# Patient Record
Sex: Male | Born: 1940
Health system: Southern US, Community
[De-identification: ages and names within clinical notes are randomized; demographics above are authoritative.]

## PROBLEM LIST (undated history)

## (undated) ENCOUNTER — Emergency Department (HOSPITAL_COMMUNITY): Admission: EM | Payer: Medicare Other | Source: Home / Self Care

## (undated) DIAGNOSIS — Z9989 Dependence on other enabling machines and devices: Secondary | ICD-10-CM

## (undated) DIAGNOSIS — I2510 Atherosclerotic heart disease of native coronary artery without angina pectoris: Secondary | ICD-10-CM

## (undated) DIAGNOSIS — Z8673 Personal history of transient ischemic attack (TIA), and cerebral infarction without residual deficits: Secondary | ICD-10-CM

## (undated) DIAGNOSIS — I10 Essential (primary) hypertension: Secondary | ICD-10-CM

## (undated) DIAGNOSIS — E785 Hyperlipidemia, unspecified: Secondary | ICD-10-CM

## (undated) DIAGNOSIS — F0390 Unspecified dementia without behavioral disturbance: Secondary | ICD-10-CM

## (undated) DIAGNOSIS — I252 Old myocardial infarction: Secondary | ICD-10-CM

## (undated) DIAGNOSIS — I509 Heart failure, unspecified: Secondary | ICD-10-CM

## (undated) DIAGNOSIS — Z87891 Personal history of nicotine dependence: Secondary | ICD-10-CM

## (undated) DIAGNOSIS — Z9049 Acquired absence of other specified parts of digestive tract: Secondary | ICD-10-CM

## (undated) DIAGNOSIS — F1011 Alcohol abuse, in remission: Secondary | ICD-10-CM

## (undated) DIAGNOSIS — G4733 Obstructive sleep apnea (adult) (pediatric): Secondary | ICD-10-CM

## (undated) DIAGNOSIS — I499 Cardiac arrhythmia, unspecified: Secondary | ICD-10-CM

## (undated) DIAGNOSIS — Z8546 Personal history of malignant neoplasm of prostate: Secondary | ICD-10-CM

## (undated) HISTORY — DX: Atherosclerotic heart disease of native coronary artery without angina pectoris: I25.10

## (undated) HISTORY — DX: Cardiac arrhythmia, unspecified: I49.9

## (undated) HISTORY — DX: Personal history of nicotine dependence: Z87.891

## (undated) HISTORY — DX: Alcohol abuse, in remission: F10.11

## (undated) HISTORY — DX: Personal history of malignant neoplasm of prostate: Z85.46

## (undated) HISTORY — DX: Heart failure, unspecified: I50.9

## (undated) HISTORY — DX: Obstructive sleep apnea (adult) (pediatric): G47.33

## (undated) HISTORY — DX: Dependence on other enabling machines and devices: Z99.89

## (undated) HISTORY — PX: CORONARY ANGIOPLASTY WITH STENT PLACEMENT: SHX49

## (undated) HISTORY — DX: Essential (primary) hypertension: I10

## (undated) HISTORY — DX: Hyperlipidemia, unspecified: E78.5

## (undated) HISTORY — DX: Unspecified dementia without behavioral disturbance: F03.90

## (undated) HISTORY — DX: Acquired absence of other specified parts of digestive tract: Z90.49

## (undated) HISTORY — DX: Personal history of transient ischemic attack (TIA), and cerebral infarction without residual deficits: Z86.73

## (undated) HISTORY — DX: Old myocardial infarction: I25.2

---

## 2002-04-30 HISTORY — PX: COLON RESECTION: SHX5231

## 2016-11-22 ENCOUNTER — Ambulatory Visit (INDEPENDENT_AMBULATORY_CARE_PROVIDER_SITE_OTHER): Payer: Medicare Other | Admitting: Internal Medicine

## 2016-11-22 ENCOUNTER — Ambulatory Visit (HOSPITAL_COMMUNITY)
Admission: RE | Admit: 2016-11-22 | Discharge: 2016-11-22 | Disposition: A | Discharge status: Home / Self Care | Payer: Medicare Other | Source: Ambulatory Visit | Attending: Family Medicine | Admitting: Family Medicine

## 2016-11-22 ENCOUNTER — Encounter: Payer: Self-pay | Admitting: Internal Medicine

## 2016-11-22 VITALS — BP 118/80 | HR 110 | Temp 98.6°F | Wt 178.0 lb

## 2016-11-22 DIAGNOSIS — E785 Hyperlipidemia, unspecified: Secondary | ICD-10-CM

## 2016-11-22 DIAGNOSIS — Z8546 Personal history of malignant neoplasm of prostate: Secondary | ICD-10-CM

## 2016-11-22 DIAGNOSIS — Z8673 Personal history of transient ischemic attack (TIA), and cerebral infarction without residual deficits: Secondary | ICD-10-CM

## 2016-11-22 DIAGNOSIS — Z87891 Personal history of nicotine dependence: Secondary | ICD-10-CM

## 2016-11-22 DIAGNOSIS — I4892 Unspecified atrial flutter: Secondary | ICD-10-CM | POA: Insufficient documentation

## 2016-11-22 DIAGNOSIS — I509 Heart failure, unspecified: Secondary | ICD-10-CM

## 2016-11-22 DIAGNOSIS — I499 Cardiac arrhythmia, unspecified: Secondary | ICD-10-CM | POA: Diagnosis not present

## 2016-11-22 DIAGNOSIS — I10 Essential (primary) hypertension: Secondary | ICD-10-CM

## 2016-11-22 DIAGNOSIS — Z87898 Personal history of other specified conditions: Secondary | ICD-10-CM

## 2016-11-22 DIAGNOSIS — G4733 Obstructive sleep apnea (adult) (pediatric): Secondary | ICD-10-CM | POA: Diagnosis not present

## 2016-11-22 DIAGNOSIS — I2510 Atherosclerotic heart disease of native coronary artery without angina pectoris: Secondary | ICD-10-CM | POA: Insufficient documentation

## 2016-11-22 DIAGNOSIS — I252 Old myocardial infarction: Secondary | ICD-10-CM

## 2016-11-22 DIAGNOSIS — Z9049 Acquired absence of other specified parts of digestive tract: Secondary | ICD-10-CM

## 2016-11-22 DIAGNOSIS — Z9989 Dependence on other enabling machines and devices: Secondary | ICD-10-CM

## 2016-11-22 DIAGNOSIS — B351 Tinea unguium: Secondary | ICD-10-CM | POA: Diagnosis not present

## 2016-11-22 DIAGNOSIS — R9431 Abnormal electrocardiogram [ECG] [EKG]: Secondary | ICD-10-CM | POA: Insufficient documentation

## 2016-11-22 DIAGNOSIS — R262 Difficulty in walking, not elsewhere classified: Secondary | ICD-10-CM

## 2016-11-22 DIAGNOSIS — I4430 Unspecified atrioventricular block: Secondary | ICD-10-CM | POA: Insufficient documentation

## 2016-11-22 DIAGNOSIS — R0989 Other specified symptoms and signs involving the circulatory and respiratory systems: Secondary | ICD-10-CM | POA: Insufficient documentation

## 2016-11-22 DIAGNOSIS — F1011 Alcohol abuse, in remission: Secondary | ICD-10-CM

## 2016-11-22 HISTORY — DX: Old myocardial infarction: I25.2

## 2016-11-22 HISTORY — DX: Personal history of malignant neoplasm of prostate: Z85.46

## 2016-11-22 HISTORY — DX: Obstructive sleep apnea (adult) (pediatric): G47.33

## 2016-11-22 HISTORY — DX: Acquired absence of other specified parts of digestive tract: Z90.49

## 2016-11-22 HISTORY — DX: Essential (primary) hypertension: I10

## 2016-11-22 HISTORY — DX: Alcohol abuse, in remission: F10.11

## 2016-11-22 HISTORY — DX: Hyperlipidemia, unspecified: E78.5

## 2016-11-22 HISTORY — DX: Atherosclerotic heart disease of native coronary artery without angina pectoris: I25.10

## 2016-11-22 HISTORY — DX: Personal history of nicotine dependence: Z87.891

## 2016-11-22 HISTORY — DX: Personal history of transient ischemic attack (TIA), and cerebral infarction without residual deficits: Z86.73

## 2016-11-22 HISTORY — DX: Dependence on other enabling machines and devices: Z99.89

## 2016-11-22 HISTORY — DX: Heart failure, unspecified: I50.9

## 2016-11-22 HISTORY — DX: Cardiac arrhythmia, unspecified: I49.9

## 2016-11-22 NOTE — Patient Instructions (Addendum)
Thank you for coming. We will get labs today. We made an urgent referral to the heart doctor because of the irregular heart rhythm.  I will call you when I get the records I made a referral to the heart doctor and podiatry.  Let's wait for the oncology referral until I get your past records as it is possible that I can manage this.

## 2016-11-22 NOTE — Assessment & Plan Note (Addendum)
New finding. Patient is asymptomatic . EKG with atrial flutter that is rate controlled. Urgent referral to cardiology. Already on Metoprolol. ED precautions discussed until seen by cardiology. CBC and BMP obtain for further evaluation. CHADSVASC score of 7. Will wait until we obtain result of kidney function and cardiology input to start anticoagulation. Discussed with preceptor.

## 2016-11-22 NOTE — Progress Notes (Signed)
   [facility] Phone: [phone]   Date of Visit: 11/22/2016   HPI:  Patient is here to establish care. He is here with his wife. He and his wife recently moved to [place]. He requests a referral to podiatry to get his nails trimmed.   PMH:  CAD with stent placed in 1998 History of alcohol abuse (1970s) Hx of Prostate Cancer s/p radiation in 2015 CHF 1998 History of MI HLD  HTN History of OSA (off CPAP since May 2018) History of Stroke   Surgery:  Partial colon resection due to polyp 2004 (benign)  Vaccines:  Reports uptodate on: - tetanus 2015 - PNA 2015 - shingles 2017   Family History:  Mother: HTN, HLD Father: alcohol/drug abuse, heart disease Sister: asthma Brother: healthy  Social History:  - lives with wife - former smoker (quit 1980s) - no alcohol use (history of alcohol use)  - walks daily   ROS:  Review of Systems  Constitutional: Negative for chills, fever and weight loss.  HENT: Negative for congestion and sore throat.   Eyes: Negative for blurred vision.  Respiratory: Negative for cough and shortness of breath.   Cardiovascular: Negative for chest pain, palpitations, orthopnea and leg swelling.  Gastrointestinal: Negative for abdominal pain, constipation, diarrhea, nausea and vomiting.  Genitourinary: Negative for frequency and hematuria.  Musculoskeletal: Negative for joint pain.  Skin: Negative for rash.  Neurological: Negative for dizziness and headaches.  Endo/Heme/Allergies: Negative for polydipsia.  Psychiatric/Behavioral: Positive for memory loss. Negative for depression. The patient is not nervous/anxious.     PHYSICAL EXAM: BP 118/80   Pulse (!) 110   Temp 98.6 F (37 C) (Oral)   Wt 178 lb (80.7 kg)   SpO2 93%  GEN: NAD, sitting on a wheel chair HEENT: Atraumatic, normocephalic, neck supple, EOMI, sclera clear  CV: rate 92 on manual count, irregular rhythm, no murmurs, rubs, or gallops PULM: CTAB,  normal effort ABD: Soft, nontender, nondistended, NABS, no organomegaly SKIN: No rash or cyanosis; warm and well-perfused EXTR: No lower extremity edema or calf tenderness, thick long toe nails bilaterally with onychomycosis of left great toe nail PSYCH: Mood and affect euthymic, normal rate and volume of speech NEURO: Awake, alert, no focal deficits grossly, normal speech   ASSESSMENT/PLAN:  Health maintenance:  - will obtain records from: - Dr. Nona Dell in [place] (oncologist) - Dr. Jimmie Molly PCP ([facility])  - Dr. Luanna Cole Cardiologist at [facility]   Irregular cardiac rhythm New finding. Patient is asymptomatic . EKG with atrial flutter that is rate controlled. Urgent referral to cardiology. Already on Metoprolol. ED precautions discussed until seen by cardiology. CBC and BMP obtain for further evaluation. CHADSVASC score of 7. Will wait until we obtain result of kidney function and cardiology input to start anticoagulation. Discussed with preceptor.   Onychomycosis:  - referral to podiatry   Follow up pending retrieval of records   Smiley Houseman, MD PGY [place]

## 2016-11-23 ENCOUNTER — Encounter: Payer: Self-pay | Admitting: Internal Medicine

## 2016-11-23 DIAGNOSIS — F0390 Unspecified dementia without behavioral disturbance: Secondary | ICD-10-CM

## 2016-11-23 HISTORY — DX: Unspecified dementia, unspecified severity, without behavioral disturbance, psychotic disturbance, mood disturbance, and anxiety: F03.90

## 2016-11-23 LAB — CBC
Hematocrit: 48 % (ref 37.5–51.0)
Hemoglobin: 15.8 g/dL (ref 13.0–17.7)
MCH: 31.9 pg (ref 26.6–33.0)
MCHC: 32.9 g/dL (ref 31.5–35.7)
MCV: 97 fL (ref 79–97)
PLATELETS: 214 10*3/uL (ref 150–379)
RBC: 4.95 x10E6/uL (ref 4.14–5.80)
RDW: 13.5 % (ref 12.3–15.4)
WBC: 7.1 10*3/uL (ref 3.4–10.8)

## 2016-11-23 LAB — BASIC METABOLIC PANEL
BUN / CREAT RATIO: 9 — AB (ref 10–24)
BUN: 11 mg/dL (ref 8–27)
CHLORIDE: 106 mmol/L (ref 96–106)
CO2: 23 mmol/L (ref 20–29)
Calcium: 9.6 mg/dL (ref 8.6–10.2)
Creatinine, Ser: 1.26 mg/dL (ref 0.76–1.27)
GFR calc non Af Amer: 56 mL/min/{1.73_m2} — ABNORMAL LOW (ref >59)
GFR, EST AFRICAN AMERICAN: 65 mL/min/{1.73_m2} (ref >59)
GLUCOSE: 70 mg/dL (ref 65–99)
POTASSIUM: 4.7 mmol/L (ref 3.5–5.2)
Sodium: 143 mmol/L (ref 134–144)

## 2016-11-26 ENCOUNTER — Ambulatory Visit (INDEPENDENT_AMBULATORY_CARE_PROVIDER_SITE_OTHER): Payer: Medicare Other | Admitting: Cardiology

## 2016-11-26 VITALS — BP 150/82 | HR 89 | Ht 71.5 in | Wt 182.0 lb

## 2016-11-26 DIAGNOSIS — I2510 Atherosclerotic heart disease of native coronary artery without angina pectoris: Secondary | ICD-10-CM

## 2016-11-26 DIAGNOSIS — I10 Essential (primary) hypertension: Secondary | ICD-10-CM

## 2016-11-26 DIAGNOSIS — Z87898 Personal history of other specified conditions: Secondary | ICD-10-CM | POA: Diagnosis not present

## 2016-11-26 DIAGNOSIS — Z87891 Personal history of nicotine dependence: Secondary | ICD-10-CM

## 2016-11-26 DIAGNOSIS — I4892 Unspecified atrial flutter: Secondary | ICD-10-CM | POA: Diagnosis not present

## 2016-11-26 DIAGNOSIS — Z8673 Personal history of transient ischemic attack (TIA), and cerebral infarction without residual deficits: Secondary | ICD-10-CM

## 2016-11-26 DIAGNOSIS — E782 Mixed hyperlipidemia: Secondary | ICD-10-CM | POA: Diagnosis not present

## 2016-11-26 DIAGNOSIS — F1011 Alcohol abuse, in remission: Secondary | ICD-10-CM

## 2016-11-26 NOTE — Progress Notes (Deleted)
Cardiology Office Note:    Date:  11/26/2016   ID:  Charles Chan, DOB 04/02/1941, MRN [id number]  PCP:  Smiley Houseman, MD  Cardiologist:  Jenean Lindau, MD   Referring MD: Smiley Houseman, MD    ASSESSMENT:    1. Essential hypertension   2. Coronary artery disease involving native coronary artery of native heart without angina pectoris   3. History of stroke   4. Mixed hyperlipidemia   5. Former smoker   6. History of alcohol abuse   7. Atrial flutter, unspecified type ([place])    PLAN:    In order of problems listed above:  1. ***   Medication Adjustments/Labs and Tests Ordered: Current medicines are reviewed at length with the patient today.  Concerns regarding medicines are outlined above.  No orders of the defined types were placed in this encounter.  No orders of the defined types were placed in this encounter.    History of Present Illness:    Charles Chan is a 76 y.o. male who is being seen today for the evaluation of *** at the request of Dallas Schimke, Almond Lint, MD. ***  Past Medical History:  Diagnosis Date  . Ambulates with cane 11/22/2016   In the 90s, patient injured his knee (ligament tear?) and declined surgery. Since then he has been using a cane to ambulate. Reports he is not in pain.   Marland Kitchen CAD (coronary artery disease) 11/22/2016   With Stent in 1998  . CHF (congestive heart failure) ([place]) 11/22/2016   Diagnosed in 1998  . Dementia 11/23/2016  . Former smoker 11/22/2016   Quit 1980s  . H/O partial resection of colon 11/22/2016   Due to polyp in 2004   . History of alcohol abuse 11/22/2016   1970s  . History of MI (myocardial infarction) 11/22/2016   2001  . History of prostate cancer 11/22/2016   S/p radiation in 2015? Was followed by previous oncologist yearly (Dr. Nona Dell in [place])   . History of stroke 11/22/2016  . HLD (hyperlipidemia) 11/22/2016  . HTN (hypertension) 11/22/2016  . Irregular cardiac rhythm 11/22/2016  .  OSA (obstructive sleep apnea) 11/22/2016   History of OSA. Has not required CPAP since May 2018    Past Surgical History:  Procedure Laterality Date  . COLON RESECTION  2004  . CORONARY ANGIOPLASTY WITH STENT PLACEMENT      Current Medications: Current Meds  Medication Sig  . amLODipine (NORVASC) 10 MG tablet Take 5 mg by mouth daily.  Marland Kitchen aspirin 325 MG EC tablet Take 325 mg by mouth daily.  . cyanocobalamin 1000 MCG tablet Take 1,000 mcg by mouth every 30 (thirty) days.  . metoprolol succinate (TOPROL-XL) 25 MG 24 hr tablet Take 25 mg by mouth daily.  . nitroGLYCERIN (NITROSTAT) 0.4 MG SL tablet Place 0.4 mg under the tongue every 5 (five) minutes as needed for chest pain.  . simvastatin (ZOCOR) 20 MG tablet Take 20 mg by mouth daily at 6 PM.     Allergies:   Shellfish allergy   Social History   Social History  . Marital status: Married    Spouse name: N/A  . Number of children: N/A  . Years of education: N/A   Social History Main Topics  . Smoking status: Former Smoker    Quit date: 1980  . Smokeless tobacco: Never Used  . Alcohol use No  . Drug use: No  . Sexual activity: Not Asked   Other  Topics Concern  . None   Social History Narrative   Ambulates with a cane.         Family History: The patient's family history includes Alcohol abuse in his father; Asthma in his sister; Drug abuse in his father; Healthy in his brother; Heart disease in his father; Hyperlipidemia in his mother; Hypertension in his mother.  ROS:   Please see the history of present illness.    All other systems reviewed and are negative.  EKGs/Labs/Other Studies Reviewed:    The following studies were reviewed today: ***   Recent Labs: 11/22/2016: BUN 11; Creatinine, Ser 1.26; Hemoglobin 15.8; Platelets 214; Potassium 4.7; Sodium 143  Recent Lipid Panel No results found for: CHOL, TRIG, HDL, CHOLHDL, VLDL, LDLCALC, LDLDIRECT  Physical Exam:    VS:  BP (!) 150/78 (BP Location: Left  Arm)   Pulse 89   Ht 5' 11.5" (1.816 m)   Wt 182 lb (82.6 kg)   SpO2 93%   BMI 25.03 kg/m     Wt Readings from Last 3 Encounters:  11/26/16 182 lb (82.6 kg)  11/22/16 178 lb (80.7 kg)     GEN: Patient is in no acute distress HEENT: Normal NECK: No JVD; No carotid bruits LYMPHATICS: No lymphadenopathy CARDIAC: S1 S2 regular, 2/6 systolic murmur at the apex. RESPIRATORY:  Clear to auscultation without rales, wheezing or rhonchi  ABDOMEN: Soft, non-tender, non-distended MUSCULOSKELETAL:  No edema; No deformity  SKIN: Warm and dry NEUROLOGIC:  Alert and oriented x 3 PSYCHIATRIC:  Normal affect    Signed, Jenean Lindau, MD  11/26/2016 1:58 PM    [facility]

## 2016-11-26 NOTE — Progress Notes (Signed)
Cardiology Office Note:    Date:  11/26/2016   ID:  Charles Chan, DOB [birth year], MRN [id number]  PCP:  Smiley Houseman, MD  Cardiologist:  Jenean Lindau, MD   Referring MD: Smiley Houseman, MD    ASSESSMENT:    1. Essential hypertension   2. Coronary artery disease involving native coronary artery of native heart without angina pectoris   3. History of stroke   4. Mixed hyperlipidemia   5. Former smoker   6. History of alcohol abuse   7. Atrial flutter, unspecified type ([place])    PLAN:    In order of problems listed above:  1. I discussed with the patient atrial flutter, disease process. Management and therapy including rate and rhythm control, anticoagulation benefits and potential risks were discussed extensively with the patient. Patient had multiple questions which were answered to patient's satisfaction. I called his primary care physician and had a lengthy discussion about the patient's condition. He rates appear to be optimally controlled but to ensure this he will undergo Holter monitoring. Patient is not a candidate for anticoagulation because of high fall propensity. He also has high risk of stroke however his risks exceed the benefits in a significant way. I discussed this with his primary care provider and his wife and the patient also and he verbalized understanding and will continue full-strength aspirin. Depending on the Holter monitor report I we'll adjust medications for rate control. 2. Coronary artery disease is clinically stable and secondary prevention including lipids are managed by her primary care physician. I discussed this with the patient at length 3. His blood pressure is elevated but his wife mentions to me that his pressure stable at home and she will keep track of it. 4. He will be seen in follow-up appointment in a month or earlier if he has any concerns. He knows to go to the nearest emergency room for any significant concerns. I  advised him never to go back to alcohol or smoking ever in the future and he completely agrees.   Medication Adjustments/Labs and Tests Ordered: Current medicines are reviewed at length with the patient today.  Concerns regarding medicines are outlined above.  Orders Placed This Encounter  Procedures  . Holter monitor - 24 hour  . EKG 12-Lead  . ECHOCARDIOGRAM COMPLETE   No orders of the defined types were placed in this encounter.    History of Present Illness:    Charles Chan is a 76 y.o. male who is being seen today for the evaluation of Newly diagnosed atrial flutter at the request of Smiley Houseman, MD. Patient is a 76 year old male with past medical history of coronary artery disease, essential hypertension, congestive heart failure and dementia. He recently saw his family physician and was found to be in atrial flutter and therefore was referred here. He is a poor historian and history was obtained from his wife who accompanies him. I also called his primary care provider to discuss the details of her evaluation a few days ago. Patient denies any chest pain orthopnea PND or any palpitations. At the time of my evaluation is alert awake oriented and in no distress. Next  Past Medical History:  Diagnosis Date  . Ambulates with cane 11/22/2016   In the 90s, patient injured his knee (ligament tear?) and declined surgery. Since then he has been using a cane to ambulate. Reports he is not in pain.   Marland Kitchen CAD (coronary artery disease) 11/22/2016  With Stent in 1998  . CHF (congestive heart failure) ([place]) 11/22/2016   Diagnosed in 1998  . Dementia 11/23/2016  . Former smoker 11/22/2016   Quit 1980s  . H/O partial resection of colon 11/22/2016   Due to polyp in 2004   . History of alcohol abuse 11/22/2016   1970s  . History of MI (myocardial infarction) 11/22/2016   2001  . History of prostate cancer 11/22/2016   S/p radiation in 2015? Was followed by previous oncologist yearly  (Dr. Nona Dell in [place])   . History of stroke 11/22/2016  . HLD (hyperlipidemia) 11/22/2016  . HTN (hypertension) 11/22/2016  . Irregular cardiac rhythm 11/22/2016  . OSA (obstructive sleep apnea) 11/22/2016   History of OSA. Has not required CPAP since May 2018    Past Surgical History:  Procedure Laterality Date  . COLON RESECTION  2004  . CORONARY ANGIOPLASTY WITH STENT PLACEMENT      Current Medications: Current Meds  Medication Sig  . amLODipine (NORVASC) 10 MG tablet Take 5 mg by mouth daily.  Marland Kitchen aspirin 325 MG EC tablet Take 325 mg by mouth daily.  . cyanocobalamin 1000 MCG tablet Take 1,000 mcg by mouth every 30 (thirty) days.  . metoprolol succinate (TOPROL-XL) 25 MG 24 hr tablet Take 25 mg by mouth daily.  . nitroGLYCERIN (NITROSTAT) 0.4 MG SL tablet Place 0.4 mg under the tongue every 5 (five) minutes as needed for chest pain.  . simvastatin (ZOCOR) 20 MG tablet Take 20 mg by mouth daily at 6 PM.     Allergies:   Shellfish allergy   Social History   Social History  . Marital status: Married    Spouse name: N/A  . Number of children: N/A  . Years of education: N/A   Social History Main Topics  . Smoking status: Former Smoker    Quit date: 1980  . Smokeless tobacco: Never Used  . Alcohol use No  . Drug use: No  . Sexual activity: Not Asked   Other Topics Concern  . None   Social History Narrative   Ambulates with a cane.         Family History: The patient's family history includes Alcohol abuse in his father; Asthma in his sister; Drug abuse in his father; Healthy in his brother; Heart disease in his father; Hyperlipidemia in his mother; Hypertension in his mother.  ROS:   Please see the history of present illness.    All other systems reviewed and are negative.  EKGs/Labs/Other Studies Reviewed:    The following studies were reviewed today: EKG reveals atrial flutter with well-controlled ventricular rate. Records from primary care physician's  office was referred at extensive length.   Recent Labs: 11/22/2016: BUN 11; Creatinine, Ser 1.26; Hemoglobin 15.8; Platelets 214; Potassium 4.7; Sodium 143  Recent Lipid Panel No results found for: CHOL, TRIG, HDL, CHOLHDL, VLDL, LDLCALC, LDLDIRECT  Physical Exam:    VS:  BP (!) 150/78 (BP Location: Left Arm)   Pulse 89   Ht 5' 11.5" (1.816 m)   Wt 182 lb (82.6 kg)   SpO2 93%   BMI 25.03 kg/m     Wt Readings from Last 3 Encounters:  11/26/16 182 lb (82.6 kg)  11/22/16 178 lb (80.7 kg)     GEN: Patient is in no acute distress HEENT: Normal NECK: No JVD; No carotid bruits LYMPHATICS: No lymphadenopathy CARDIAC: S1 S2 regular, 2/6 systolic murmur at the apex. RESPIRATORY:  Clear to auscultation without rales, wheezing  or rhonchi  ABDOMEN: Soft, non-tender, non-distended MUSCULOSKELETAL:  No edema; No deformity  SKIN: Warm and dry NEUROLOGIC:  Alert and oriented x 3 PSYCHIATRIC:  Normal affect    Signed, Jenean Lindau, MD  11/26/2016 2:45 PM    [facility]

## 2016-11-26 NOTE — Patient Instructions (Signed)
Medication Instructions:  Your physician recommends that you continue on your current medications as directed. Please refer to the Current Medication list given to you today.   Labwork: None   Testing/Procedures: Your physician has requested that you have an echocardiogram. Echocardiography is a painless test that uses sound waves to create images of your heart. It provides your doctor with information about the size and shape of your heart and how well your heart's chambers and valves are working. This procedure takes approximately one hour. There are no restrictions for this procedure.  Your physician has recommended that you wear a holter monitor. Holter monitors are medical devices that record the heart's electrical activity. Doctors most often use these monitors to diagnose arrhythmias. Arrhythmias are problems with the speed or rhythm of the heartbeat. The monitor is a small, portable device. You can wear one while you do your normal daily activities. This is usually used to diagnose what is causing palpitations/syncope (passing out).  You had an EKG Today   Follow-Up: Your physician recommends that you schedule a follow-up appointment in: 1 month with Dr Geraldo Pitter    Any Other Special Instructions Will Be Listed Below (If Applicable).     If you need a refill on your cardiac medications before your next appointment, please call your pharmacy.

## 2016-12-06 ENCOUNTER — Ambulatory Visit (INDEPENDENT_AMBULATORY_CARE_PROVIDER_SITE_OTHER): Payer: Medicare Other

## 2016-12-06 ENCOUNTER — Other Ambulatory Visit: Payer: Self-pay

## 2016-12-06 ENCOUNTER — Ambulatory Visit (HOSPITAL_COMMUNITY): Payer: Medicare Other | Attending: Cardiology

## 2016-12-06 DIAGNOSIS — I110 Hypertensive heart disease with heart failure: Secondary | ICD-10-CM | POA: Insufficient documentation

## 2016-12-06 DIAGNOSIS — I083 Combined rheumatic disorders of mitral, aortic and tricuspid valves: Secondary | ICD-10-CM | POA: Diagnosis not present

## 2016-12-06 DIAGNOSIS — I10 Essential (primary) hypertension: Secondary | ICD-10-CM

## 2016-12-06 DIAGNOSIS — I4892 Unspecified atrial flutter: Secondary | ICD-10-CM

## 2016-12-06 DIAGNOSIS — I2510 Atherosclerotic heart disease of native coronary artery without angina pectoris: Secondary | ICD-10-CM

## 2016-12-06 DIAGNOSIS — Z8673 Personal history of transient ischemic attack (TIA), and cerebral infarction without residual deficits: Secondary | ICD-10-CM

## 2016-12-06 DIAGNOSIS — G4733 Obstructive sleep apnea (adult) (pediatric): Secondary | ICD-10-CM | POA: Diagnosis not present

## 2016-12-06 DIAGNOSIS — Z87898 Personal history of other specified conditions: Secondary | ICD-10-CM

## 2016-12-06 DIAGNOSIS — I509 Heart failure, unspecified: Secondary | ICD-10-CM | POA: Diagnosis not present

## 2016-12-06 DIAGNOSIS — I219 Acute myocardial infarction, unspecified: Secondary | ICD-10-CM | POA: Diagnosis not present

## 2016-12-06 DIAGNOSIS — Z87891 Personal history of nicotine dependence: Secondary | ICD-10-CM | POA: Diagnosis not present

## 2016-12-06 DIAGNOSIS — F1011 Alcohol abuse, in remission: Secondary | ICD-10-CM

## 2016-12-06 DIAGNOSIS — E782 Mixed hyperlipidemia: Secondary | ICD-10-CM | POA: Diagnosis not present

## 2016-12-06 DIAGNOSIS — E785 Hyperlipidemia, unspecified: Secondary | ICD-10-CM | POA: Diagnosis not present

## 2016-12-06 DIAGNOSIS — F0390 Unspecified dementia without behavioral disturbance: Secondary | ICD-10-CM | POA: Insufficient documentation

## 2016-12-06 DIAGNOSIS — I371 Nonrheumatic pulmonary valve insufficiency: Secondary | ICD-10-CM | POA: Insufficient documentation

## 2016-12-19 ENCOUNTER — Encounter: Payer: Self-pay | Admitting: Podiatry

## 2016-12-19 ENCOUNTER — Telehealth: Payer: Self-pay

## 2016-12-19 ENCOUNTER — Ambulatory Visit (INDEPENDENT_AMBULATORY_CARE_PROVIDER_SITE_OTHER): Payer: Medicare Other | Admitting: Podiatry

## 2016-12-19 DIAGNOSIS — M79676 Pain in unspecified toe(s): Secondary | ICD-10-CM | POA: Diagnosis not present

## 2016-12-19 DIAGNOSIS — B351 Tinea unguium: Secondary | ICD-10-CM

## 2016-12-19 NOTE — Telephone Encounter (Signed)
-----   Message from Jenean Lindau, MD sent at 12/19/2016  3:07 PM EDT ----- Regarding: FW: Abnormal 48 hour holter monitor results Pl call pt about holter report. Stop long acting metoprolol and start metoprolol tartrate 12.5mg  in morning only ----- Message ----- From: Markus Daft A Sent: 12/11/2016   5:10 PM To: Orland Penman, CMA, Jenean Lindau, MD Subject: Abnormal 48 hour holter monitor results        Abnormal 48 hour holter monitor results available for your review.  Please go to " [employer] Studies,  Assigned to me,  Reading Work List ",  to review patients monitor and sign the study.   Thank you, Darrick Penna

## 2016-12-19 NOTE — Progress Notes (Signed)
Patient ID: Charles Chan, male   DOB: 1940/05/02, 76 y.o.   MRN: [id number]   SUBJECTIVE Patient  presents to office today complaining of elongated, thickened nails. Pain while ambulating in shoes. Patient is unable to trim their own nails.   OBJECTIVE General Patient is awake, alert, and oriented x 3 and in no acute distress. Derm Skin is dry and supple bilateral. Negative open lesions or macerations. Remaining integument unremarkable. Nails are tender, long, thickened and dystrophic with subungual debris, consistent with onychomycosis, 1-5 bilateral. No signs of infection noted. Vasc  DP and PT pedal pulses palpable bilaterally. Temperature gradient within normal limits.  Neuro Epicritic and protective threshold sensation diminished bilaterally.  Musculoskeletal Exam No symptomatic pedal deformities noted bilateral. Muscular strength within normal limits.  ASSESSMENT 1. Onychodystrophic nails 1-5 bilateral with hyperkeratosis of nails.  2. Onychomycosis of nail due to dermatophyte bilateral 3. Pain in foot bilateral  PLAN OF CARE 1. Patient evaluated today.  2. Instructed to maintain good pedal hygiene and foot care.  3. Mechanical debridement of nails 1-5 bilaterally performed using a nail nipper. Filed with dremel without incident.  4. Return to clinic in 3 mos.    Edrick Kins, DPM [facility]  Dr. Edrick Kins, [address]                                        [place], [place] [address]                Office [phone]  Fax [phone]

## 2016-12-19 NOTE — Telephone Encounter (Signed)
Attempted to call pt with abnormal results and new medication regiment. No answer or VM. Will try again later.

## 2016-12-19 NOTE — Progress Notes (Signed)
   Subjective:    Patient ID: Charles Chan, male    DOB: 01-27-1941, 76 y.o.   MRN: [id number]  HPI    Review of Systems  All other systems reviewed and are negative.      Objective:   Physical Exam        Assessment & Plan:

## 2017-02-06 ENCOUNTER — Ambulatory Visit (INDEPENDENT_AMBULATORY_CARE_PROVIDER_SITE_OTHER): Payer: Medicare Other | Admitting: *Deleted

## 2017-02-06 DIAGNOSIS — Z23 Encounter for immunization: Secondary | ICD-10-CM | POA: Diagnosis not present

## 2017-03-24 ENCOUNTER — Emergency Department (HOSPITAL_COMMUNITY): Payer: Medicare Other

## 2017-03-24 ENCOUNTER — Encounter (HOSPITAL_COMMUNITY): Payer: Self-pay | Admitting: Emergency Medicine

## 2017-03-24 ENCOUNTER — Inpatient Hospital Stay (HOSPITAL_COMMUNITY)
Admission: EM | Admit: 2017-03-24 | Discharge: 2017-03-27 | DRG: 392 | Disposition: A | Discharge status: Home / Self Care | Payer: Medicare Other | Attending: Family Medicine | Admitting: Family Medicine

## 2017-03-24 DIAGNOSIS — K529 Noninfective gastroenteritis and colitis, unspecified: Secondary | ICD-10-CM | POA: Diagnosis not present

## 2017-03-24 DIAGNOSIS — R05 Cough: Secondary | ICD-10-CM

## 2017-03-24 DIAGNOSIS — K630 Abscess of intestine: Secondary | ICD-10-CM | POA: Diagnosis present

## 2017-03-24 DIAGNOSIS — N179 Acute kidney failure, unspecified: Secondary | ICD-10-CM | POA: Diagnosis present

## 2017-03-24 DIAGNOSIS — Z7982 Long term (current) use of aspirin: Secondary | ICD-10-CM

## 2017-03-24 DIAGNOSIS — E785 Hyperlipidemia, unspecified: Secondary | ICD-10-CM | POA: Diagnosis present

## 2017-03-24 DIAGNOSIS — I2510 Atherosclerotic heart disease of native coronary artery without angina pectoris: Secondary | ICD-10-CM | POA: Diagnosis present

## 2017-03-24 DIAGNOSIS — I10 Essential (primary) hypertension: Secondary | ICD-10-CM

## 2017-03-24 DIAGNOSIS — R059 Cough, unspecified: Secondary | ICD-10-CM

## 2017-03-24 DIAGNOSIS — Z955 Presence of coronary angioplasty implant and graft: Secondary | ICD-10-CM

## 2017-03-24 DIAGNOSIS — I509 Heart failure, unspecified: Secondary | ICD-10-CM | POA: Diagnosis present

## 2017-03-24 DIAGNOSIS — I252 Old myocardial infarction: Secondary | ICD-10-CM

## 2017-03-24 DIAGNOSIS — F0390 Unspecified dementia without behavioral disturbance: Secondary | ICD-10-CM | POA: Diagnosis present

## 2017-03-24 DIAGNOSIS — I110 Hypertensive heart disease with heart failure: Secondary | ICD-10-CM | POA: Diagnosis present

## 2017-03-24 DIAGNOSIS — Z8546 Personal history of malignant neoplasm of prostate: Secondary | ICD-10-CM

## 2017-03-24 DIAGNOSIS — Z87891 Personal history of nicotine dependence: Secondary | ICD-10-CM

## 2017-03-24 DIAGNOSIS — Z79899 Other long term (current) drug therapy: Secondary | ICD-10-CM

## 2017-03-24 DIAGNOSIS — Z85038 Personal history of other malignant neoplasm of large intestine: Secondary | ICD-10-CM

## 2017-03-24 DIAGNOSIS — Z8673 Personal history of transient ischemic attack (TIA), and cerebral infarction without residual deficits: Secondary | ICD-10-CM

## 2017-03-24 DIAGNOSIS — I4892 Unspecified atrial flutter: Secondary | ICD-10-CM | POA: Diagnosis present

## 2017-03-24 DIAGNOSIS — J439 Emphysema, unspecified: Secondary | ICD-10-CM | POA: Diagnosis present

## 2017-03-24 DIAGNOSIS — G4733 Obstructive sleep apnea (adult) (pediatric): Secondary | ICD-10-CM | POA: Diagnosis present

## 2017-03-24 DIAGNOSIS — R109 Unspecified abdominal pain: Secondary | ICD-10-CM | POA: Diagnosis not present

## 2017-03-24 LAB — URINALYSIS, ROUTINE W REFLEX MICROSCOPIC
Bilirubin Urine: NEGATIVE
Glucose, UA: NEGATIVE mg/dL
HGB URINE DIPSTICK: NEGATIVE
Ketones, ur: NEGATIVE mg/dL
Leukocytes, UA: NEGATIVE
NITRITE: NEGATIVE
PROTEIN: NEGATIVE mg/dL
Specific Gravity, Urine: >1.046 — ABNORMAL HIGH (ref 1.005–1.030)
pH: 6 (ref 5.0–8.0)

## 2017-03-24 LAB — LIPASE, BLOOD: Lipase: 21 U/L (ref 11–51)

## 2017-03-24 LAB — CREATININE, SERUM
Creatinine, Ser: 1.1 mg/dL (ref 0.61–1.24)
GFR calc Af Amer: >60 mL/min (ref >60)
GFR calc non Af Amer: >60 mL/min (ref >60)

## 2017-03-24 LAB — COMPREHENSIVE METABOLIC PANEL
ALK PHOS: 78 U/L (ref 38–126)
ALT: 11 U/L — AB (ref 17–63)
AST: 17 U/L (ref 15–41)
Albumin: 3.5 g/dL (ref 3.5–5.0)
Anion gap: 6 (ref 5–15)
BILIRUBIN TOTAL: 1 mg/dL (ref 0.3–1.2)
BUN: 12 mg/dL (ref 6–20)
CALCIUM: 9 mg/dL (ref 8.9–10.3)
CO2: 22 mmol/L (ref 22–32)
CREATININE: 1.31 mg/dL — AB (ref 0.61–1.24)
Chloride: 110 mmol/L (ref 101–111)
GFR, EST NON AFRICAN AMERICAN: 52 mL/min — AB (ref >60)
Glucose, Bld: 109 mg/dL — ABNORMAL HIGH (ref 65–99)
Potassium: 3.6 mmol/L (ref 3.5–5.1)
Sodium: 138 mmol/L (ref 135–145)
TOTAL PROTEIN: 7.1 g/dL (ref 6.5–8.1)

## 2017-03-24 LAB — CBC
HCT: 43.5 % (ref 39.0–52.0)
HEMATOCRIT: 43.1 % (ref 39.0–52.0)
Hemoglobin: 15 g/dL (ref 13.0–17.0)
Hemoglobin: 14.6 g/dL (ref 13.0–17.0)
MCH: 32.5 pg (ref 26.0–34.0)
MCH: 31.9 pg (ref 26.0–34.0)
MCHC: 34.5 g/dL (ref 30.0–36.0)
MCHC: 33.9 g/dL (ref 30.0–36.0)
MCV: 94.2 fL (ref 78.0–100.0)
MCV: 94.3 fL (ref 78.0–100.0)
PLATELETS: 196 10*3/uL (ref 150–400)
PLATELETS: 188 10*3/uL (ref 150–400)
RBC: 4.62 MIL/uL (ref 4.22–5.81)
RBC: 4.57 MIL/uL (ref 4.22–5.81)
RDW: 13.4 % (ref 11.5–15.5)
RDW: 13.3 % (ref 11.5–15.5)
WBC: 13.4 10*3/uL — AB (ref 4.0–10.5)
WBC: 10.8 10*3/uL — AB (ref 4.0–10.5)

## 2017-03-24 LAB — GLUCOSE, CAPILLARY: Glucose-Capillary: 121 mg/dL — ABNORMAL HIGH (ref 65–99)

## 2017-03-24 MED ORDER — ONDANSETRON HCL 4 MG/2ML IJ SOLN
4.0000 mg | Freq: Once | INTRAMUSCULAR | Status: AC
  Administered 2017-03-24: 4 mg via INTRAVENOUS
  Filled 2017-03-24: qty 2

## 2017-03-24 MED ORDER — ENOXAPARIN SODIUM 40 MG/0.4ML ~~LOC~~ SOLN
40.0000 mg | SUBCUTANEOUS | Status: DC
  Administered 2017-03-24 – 2017-03-26 (×3): 40 mg via SUBCUTANEOUS
  Filled 2017-03-24 (×3): qty 0.4

## 2017-03-24 MED ORDER — BENZONATATE 100 MG PO CAPS
100.0000 mg | ORAL_CAPSULE | Freq: Two times a day (BID) | ORAL | Status: DC | PRN
  Administered 2017-03-25: 100 mg via ORAL
  Filled 2017-03-24: qty 1

## 2017-03-24 MED ORDER — IPRATROPIUM-ALBUTEROL 0.5-2.5 (3) MG/3ML IN SOLN
3.0000 mL | Freq: Once | RESPIRATORY_TRACT | Status: AC
  Administered 2017-03-24: 3 mL via RESPIRATORY_TRACT
  Filled 2017-03-24: qty 3

## 2017-03-24 MED ORDER — PIPERACILLIN-TAZOBACTAM 3.375 G IVPB 30 MIN
3.3750 g | INTRAVENOUS | Status: AC
  Administered 2017-03-24: 3.375 g via INTRAVENOUS
  Filled 2017-03-24: qty 50

## 2017-03-24 MED ORDER — ONDANSETRON 4 MG PO TBDP
4.0000 mg | ORAL_TABLET | Freq: Three times a day (TID) | ORAL | Status: DC | PRN
  Filled 2017-03-24: qty 1

## 2017-03-24 MED ORDER — PIPERACILLIN-TAZOBACTAM 3.375 G IVPB
3.3750 g | Freq: Three times a day (TID) | INTRAVENOUS | Status: DC
  Administered 2017-03-24 – 2017-03-26 (×6): 3.375 g via INTRAVENOUS
  Filled 2017-03-24 (×7): qty 50

## 2017-03-24 MED ORDER — AMLODIPINE BESYLATE 5 MG PO TABS
5.0000 mg | ORAL_TABLET | Freq: Every day | ORAL | Status: DC
  Administered 2017-03-25 – 2017-03-27 (×3): 5 mg via ORAL
  Filled 2017-03-24 (×3): qty 1

## 2017-03-24 MED ORDER — SODIUM CHLORIDE 0.9 % IV BOLUS (SEPSIS)
1000.0000 mL | Freq: Once | INTRAVENOUS | Status: AC
Start: 2017-03-24 — End: 2017-03-24
  Administered 2017-03-24: 1000 mL via INTRAVENOUS

## 2017-03-24 MED ORDER — METOPROLOL SUCCINATE ER 25 MG PO TB24
25.0000 mg | ORAL_TABLET | Freq: Every day | ORAL | Status: DC
  Administered 2017-03-25 – 2017-03-27 (×3): 25 mg via ORAL
  Filled 2017-03-24 (×3): qty 1

## 2017-03-24 MED ORDER — IOPAMIDOL (ISOVUE-300) INJECTION 61%
INTRAVENOUS | Status: AC
  Administered 2017-03-24: 100 mL via INTRAVENOUS
  Filled 2017-03-24: qty 100

## 2017-03-24 MED ORDER — MORPHINE SULFATE (PF) 4 MG/ML IV SOLN
4.0000 mg | Freq: Once | INTRAVENOUS | Status: AC
  Administered 2017-03-24: 4 mg via INTRAVENOUS
  Filled 2017-03-24: qty 1

## 2017-03-24 NOTE — Progress Notes (Signed)
PHARMACY NOTE -  [place] has been consulted to assist with dosing of Zosyn for enteritis with concern for small abscess.  Zosyn 3.375gm IV x 1 dose ordered to be given in the ED.  Scr = 1.31  Will continue Zosyn @ 3.375gm IV q8h (each dose infused over 4 hrs).  Need for further dosage adjustment appears unlikely at present.    Will sign off at this time.  Please reconsult if a change in clinical status warrants re-evaluation of dosage.  Leone Haven, PharmD

## 2017-03-24 NOTE — Consult Note (Signed)
Surgical Consultation Requesting provider: Dr. Erin Hearing  CC: abdominal pain  HPI: History is taken from chart review as patient has dementia and is a very poor historian, no family at bedside at this time.  This is a very nice 76 year old man with a history of colon cancer prostate cancer, atrial flutter, hypertension, stroke, coronary artery dementia, who presented with 2 day history of mid abdominal pain. Reportedly was on the lower abdomen bilaterally and progressively worsened but the pain seems to have plateaued. It does not radiate. Denies any constipation diarrhea, melena, hematochezia. Has had a couple episodes of emesis which were nonbloody and nonbilious.Denies any known fevers or chills. Does endorse decreased appetite and increased urination.   Allergies  Allergen Reactions  . Shellfish Allergy Hives    Past Medical History:  Diagnosis Date  . Ambulates with cane 11/22/2016   In the 90s, patient injured his knee (ligament tear?) and declined surgery. Since then he has been using a cane to ambulate. Reports he is not in pain.   Marland Kitchen CAD (coronary artery disease) 11/22/2016   With Stent in 1998  . CHF (congestive heart failure) ([place]) 11/22/2016   Diagnosed in 1998  . Dementia 11/23/2016  . Former smoker 11/22/2016   Quit 1980s  . H/O partial resection of colon 11/22/2016   Due to polyp in 2004   . History of alcohol abuse 11/22/2016   1970s  . History of MI (myocardial infarction) 11/22/2016   2001  . History of prostate cancer 11/22/2016   S/p radiation in 2015? Was followed by previous oncologist yearly (Dr. Nona Dell in [place])   . History of stroke 11/22/2016  . HLD (hyperlipidemia) 11/22/2016  . HTN (hypertension) 11/22/2016  . Irregular cardiac rhythm 11/22/2016  . OSA (obstructive sleep apnea) 11/22/2016   History of OSA. Has not required CPAP since May 2018    Past Surgical History:  Procedure Laterality Date  . COLON RESECTION  2004  . CORONARY ANGIOPLASTY WITH STENT  PLACEMENT      Family History  Problem Relation Age of Onset  . Hyperlipidemia Mother   . Hypertension Mother   . Alcohol abuse Father   . Drug abuse Father   . Heart disease Father        passed away in his 56s due to MI  . Asthma Sister   . Healthy Brother     Social History   Socioeconomic History  . Marital status: Married    Spouse name: None  . Number of children: None  . Years of education: None  . Highest education level: None  Social Needs  . Financial resource strain: None  . Food insecurity - worry: None  . Food insecurity - inability: None  . Transportation needs - medical: None  . Transportation needs - non-medical: None  Occupational History  . None  Tobacco Use  . Smoking status: Former Smoker    Last attempt to quit: 1980    Years since quitting: 38.9  . Smokeless tobacco: Never Used  Substance and Sexual Activity  . Alcohol use: No  . Drug use: No  . Sexual activity: None  Other Topics Concern  . None  Social History Narrative   Ambulates with a cane.     No current facility-administered medications on file prior to encounter.    Current Outpatient Medications on File Prior to Encounter  Medication Sig Dispense Refill  . amLODipine (NORVASC) 10 MG tablet Take 5 mg by mouth daily.    Marland Kitchen  aspirin 325 MG EC tablet Take 325 mg by mouth daily.    . cyanocobalamin (,VITAMIN B-12,) 1000 MCG/ML injection INJECT 1 ML INTRAMUSCULARLY ONCE A MONTH  6  . docusate sodium (COLACE) 100 MG capsule Take 100 mg by mouth daily.    . metoprolol succinate (TOPROL-XL) 25 MG 24 hr tablet Take 25 mg by mouth daily.    . simvastatin (ZOCOR) 20 MG tablet Take 20 mg by mouth daily at 6 PM.    . nitroGLYCERIN (NITROSTAT) 0.4 MG SL tablet Place 0.4 mg under the tongue every 5 (five) minutes as needed for chest pain.      Review of Systems: a complete, 10pt review of systems was unable to be completed due to patient mental status  Physical Exam: Vitals:   03/24/17 1729  03/24/17 2008  BP: (!) 169/75 (!) 147/60  Pulse: 86 85  Resp: 16 17  Temp: (!) 97.4 F (36.3 C) 97.8 F (36.6 C)  SpO2:  94%   Gen: alert, cooperative, oriented to self Head: normocephalic, atraumatic, EOMI, anicteric.  Neck: supple without mass or thyromegaly Chest: unlabored respirations, symmetrical air entry   Cardiovascular: RRR with palpable distal pulses, no pedal edema Abdomen: soft, nondistended, focally tender in the right mid abdomen just above and to the right of the umbilicus with voluntary guarding. The remainder of the abdomen is soft, without any tenderness. No mass or organomegaly. Here is a well-healed long midline scar and a couple of laparoscopic scars.  Extremities: warm, without edema, no deformities  Neuro: grossly intact, moves all extremities and follows commands but is only oriented to self. Psych: appropriate mood and affect  Skin: warm and dry   CBC Latest Ref Rng & Units 03/24/2017 03/24/2017 11/22/2016  WBC 4.0 - 10.5 K/uL 10.8(H) 13.4(H) 7.1  Hemoglobin 13.0 - 17.0 g/dL 14.6 15.0 15.8  Hematocrit 39.0 - 52.0 % 43.1 43.5 48.0  Platelets 150 - 400 K/uL 188 196 214    CMP Latest Ref Rng & Units 03/24/2017 03/24/2017 11/22/2016  Glucose 65 - 99 mg/dL - 109(H) 70  BUN 6 - 20 mg/dL - 12 11  Creatinine 0.61 - 1.24 mg/dL 1.10 1.31(H) 1.26  Sodium 135 - 145 mmol/L - 138 143  Potassium 3.5 - 5.1 mmol/L - 3.6 4.7  Chloride 101 - 111 mmol/L - 110 106  CO2 22 - 32 mmol/L - 22 23  Calcium 8.9 - 10.3 mg/dL - 9.0 9.6  Total Protein 6.5 - 8.1 g/dL - 7.1 -  Total Bilirubin 0.3 - 1.2 mg/dL - 1.0 -  Alkaline Phos 38 - 126 U/L - 78 -  AST 15 - 41 U/L - 17 -  ALT 17 - 63 U/L - 11(L) -    No results found for: INR, PROTIME  Imaging: Dg Chest 2 View  Result Date: 03/24/2017 CLINICAL DATA:  76 year old male with right lower quadrant pain for 2 days. EXAM: CHEST  2 VIEW COMPARISON:  None. FINDINGS: Cardiomediastinal silhouette is normal in size and configuration.  There are mild changes of COPD with prominence of the interstitium and bibasilar atelectasis. No focal parenchymal opacity, pleural effusion or pneumothorax. No acute osseous abnormality. IMPRESSION: Chronic emphysematous/ COPD changes with bibasilar atelectasis. No acute intrathoracic process. Electronically Signed   By: Kristopher Oppenheim M.D.   On: 03/24/2017 12:38   Ct Abdomen Pelvis W Contrast  Result Date: 03/24/2017 CLINICAL DATA:  Acute right lower quadrant abdominal pain. EXAM: CT ABDOMEN AND PELVIS WITH CONTRAST TECHNIQUE: Multidetector CT imaging  of the abdomen and pelvis was performed using the standard protocol following bolus administration of intravenous contrast. CONTRAST:  100 mL ISOVUE-300 IOPAMIDOL (ISOVUE-300) INJECTION 61% COMPARISON:  None. FINDINGS: Lower chest: No acute abnormality. Hepatobiliary: Cholelithiasis is noted. Multiple hepatic low densities are noted most consistent with cysts in the absence of any history of malignancy. Pancreas: Unremarkable. No pancreatic ductal dilatation or surrounding inflammatory changes. Spleen: Normal in size without focal abnormality. Adrenals/Urinary Tract: Adrenal glands appear normal. Bilateral renal cysts are noted. No hydronephrosis or renal obstruction is noted. No renal or ureteral calculi are noted. Urinary bladder appears normal. Stomach/Bowel: Large amount of stool is noted in the rectum. Stomach appears normal. The appendix appears normal. There is no evidence of bowel obstruction. Wall thickening and surrounding inflammatory changes are seen involving several small bowel loops in the central abdomen most consistent with focal enteritis. 33 x 22 x 16 mm fluid collection is seen between the small bowel loops concerning for small abscess. Vascular/Lymphatic: Aortic atherosclerosis. No enlarged abdominal or pelvic lymph nodes. Reproductive: Surgical clips are noted in prostatic bed. Patient appears to be status post prostatectomy. Other: No  abdominal wall hernia or abnormality. No abdominopelvic ascites. Musculoskeletal: No acute or significant osseous findings. IMPRESSION: Cholelithiasis. Multiple hepatic cysts. Aortic atherosclerosis. Inflamed small bowel loops are noted anteriorly in the central abdomen most consistent with focal enteritis. 3.3 x 2.2 x 1.6 cm fluid collection is seen between the small bowel loops concerning for small abscess. Electronically Signed   By: Marijo Conception, M.D.   On: 03/24/2017 13:53    A/P: 76 year old gentleman with acute abdominal pain and CT findings consistent with enteritis and possible small abscess. He is afebrile, no tachycardia, his white count is 10.8, creatinine 1.1after some fluid resuscitation..  -Agree with keeping the patient nothing by mouth, empiric antibiotics, serial abdominal exams.gentle fluid resuscitation. If no improvement could consider consulting IR to aspirate a small fluid collection.  Will continue to follow. Thank you for involving [place] in this nice man's care.    Romana Juniper, MD [facility] Surgery, [place] Pager [phone]

## 2017-03-24 NOTE — Progress Notes (Signed)
texted family practice and [facility] MD that pt is in 6N06.

## 2017-03-24 NOTE — Progress Notes (Signed)
Called about admission at 3:15 PM. Will admit to Med-Surg at [facility].

## 2017-03-24 NOTE — H&P (Signed)
[facility] Admission History and Physical Service Pager: [phone]  Patient name: Charles Chan Medical record number: [id number] Date of birth: [birth year] Age: 76 y.o. Gender: male  Primary Care Provider: Smiley Houseman, MD Consultants: GI in AM Code Status: DNI  Chief Complaint: Acute Abdominal Pain  Assessment and Plan: Charles Chan is a 76 y.o. male presenting with 2 days of acute abdominal pain. PMH is significant for significant for hx of colon cancer, hx of prostate cancer, atrial flutter, HLD, hypertension, hx of stroke, CAD, dementia  Acute abdominal pain: Concerning for enteritis with small abscess.  VSS stable.  Slightly elevated WBC 13.4.  Abdominal CT significant for focal enteritis and small abscess.  UA without any abnormalities.  The patient's acute abdominal pain due to findings on CT.  Patient without signs or symptoms of sepsis other than slightly elevated WBC -Admit to MedSurg, attending Dr. Erin Hearing -Will continue Zosyn -Consulted surgery, should follow-up with him in the morning -CBC and CMET  -PT/INR -NPO for now  -Zofran as needed for nausea   Cough: Chest x-ray significant for COPD/emphysema.  Physical exam with rhonchi -Will start patient on duonebs every 6 as needed -Tessalon Perles as needed   Hypertension: Slightly hypertensive on admission -Restart Norvasc and Lopressor 25 mg  AKI: Baseline serum creatinine 1.2.  Received a 1 L bolus in the ED -BMET tomorrow morning  Dementia: Patient alert and orientated x1.  Unable to tell us where he was or what year it was.  Per his wife this is his baseline.  Patient with no focal deficiency, moving all limbs equally -Will obtain PT /OT for patient  FEN/GI: NPO Prophylaxis: Lovenox  Disposition: Home   History of Present Illness:  Charles Chan is a 76 y.o. male presenting with abdominal pain.  Patient presenting with 2 days of acute abdominal pain  states pain is at the bottom of his abdomen both on right and left side.  Patient denies that the abdominal pain has worsened over the past couple of days states it has been about the same.  Patient denies any radiation to his back.  He denies any changes in his bowel movements.  He indicates that yesterday he threw up once and then again today he threw up once.  He denies any blood in his vomit.  He states it is nonbilious, food particles. patient does endorse increased urination.  Patient denies any fevers or chills.  Patient does endorse decreased appetite. States he has not had much to eat over the course of the past 2 days.  Though per wife has been able to take down some broth.  Patient denies anything aggravating the symptoms specifically.  Denies any blood in his stools, denies any diarrhea denies any constipation.    No other significant symptoms, the patient chronic cough.  States that this is been present for the past 2 years. Wife indicates that has been worsening over the past 2 years.No other significant symptoms  Review Of Systems: Per HPI with the following additions:  Review of Systems  Constitutional: Negative for chills and fever.  Eyes: Negative for blurred vision.  Respiratory: Positive for cough.   Cardiovascular: Negative for chest pain and orthopnea.  Gastrointestinal: Negative for blood in stool and vomiting.  Genitourinary: Positive for frequency. Negative for dysuria and hematuria.  Skin: Negative for rash.    Patient Active Problem List   Diagnosis Date Noted  . Enteritis 03/24/2017  . Atrial flutter ([place])  11/26/2016  . Dementia 11/23/2016  . Ambulates with cane 11/22/2016  . History of stroke 11/22/2016  . OSA (obstructive sleep apnea) 11/22/2016  . HTN (hypertension) 11/22/2016  . HLD (hyperlipidemia) 11/22/2016  . History of MI (myocardial infarction) 11/22/2016  . CHF (congestive heart failure) ([place]) 11/22/2016  . History of prostate cancer 11/22/2016  .  History of alcohol abuse 11/22/2016  . CAD (coronary artery disease) 11/22/2016  . H/O partial resection of colon 11/22/2016  . Former smoker 11/22/2016  . Irregular cardiac rhythm 11/22/2016    Past Medical History: Past Medical History:  Diagnosis Date  . Ambulates with cane 11/22/2016   In the 90s, patient injured his knee (ligament tear?) and declined surgery. Since then he has been using a cane to ambulate. Reports he is not in pain.   Marland Kitchen CAD (coronary artery disease) 11/22/2016   With Stent in 1998  . CHF (congestive heart failure) ([place]) 11/22/2016   Diagnosed in 1998  . Dementia 11/23/2016  . Former smoker 11/22/2016   Quit 1980s  . H/O partial resection of colon 11/22/2016   Due to polyp in 2004   . History of alcohol abuse 11/22/2016   1970s  . History of MI (myocardial infarction) 11/22/2016   2001  . History of prostate cancer 11/22/2016   S/p radiation in 2015? Was followed by previous oncologist yearly (Dr. Nona Dell in [place])   . History of stroke 11/22/2016  . HLD (hyperlipidemia) 11/22/2016  . HTN (hypertension) 11/22/2016  . Irregular cardiac rhythm 11/22/2016  . OSA (obstructive sleep apnea) 11/22/2016   History of OSA. Has not required CPAP since May 2018    Past Surgical History: Past Surgical History:  Procedure Laterality Date  . COLON RESECTION  2004  . CORONARY ANGIOPLASTY WITH STENT PLACEMENT      Social History: Social History   Tobacco Use  . Smoking status: Former Smoker    Last attempt to quit: 1980    Years since quitting: 38.9  . Smokeless tobacco: Never Used  Substance Use Topics  . Alcohol use: No  . Drug use: No   Additional social history:  Please also refer to relevant sections of EMR.  Family History: Family History  Problem Relation Age of Onset  . Hyperlipidemia Mother   . Hypertension Mother   . Alcohol abuse Father   . Drug abuse Father   . Heart disease Father        passed away in his 9s due to MI  . Asthma Sister   .  Healthy Brother    Allergies and Medications: Allergies  Allergen Reactions  . Shellfish Allergy Hives   No current facility-administered medications on file prior to encounter.    Current Outpatient Medications on File Prior to Encounter  Medication Sig Dispense Refill  . amLODipine (NORVASC) 10 MG tablet Take 5 mg by mouth daily.    Marland Kitchen aspirin 325 MG EC tablet Take 325 mg by mouth daily.    . cyanocobalamin (,VITAMIN B-12,) 1000 MCG/ML injection INJECT 1 ML INTRAMUSCULARLY ONCE A MONTH  6  . docusate sodium (COLACE) 100 MG capsule Take 100 mg by mouth daily.    . metoprolol succinate (TOPROL-XL) 25 MG 24 hr tablet Take 25 mg by mouth daily.    . simvastatin (ZOCOR) 20 MG tablet Take 20 mg by mouth daily at 6 PM.    . nitroGLYCERIN (NITROSTAT) 0.4 MG SL tablet Place 0.4 mg under the tongue every 5 (five) minutes as needed for  chest pain.      Objective: BP (!) 147/60 (BP Location: Left Arm)   Pulse 85   Temp 97.8 F (36.6 C) (Oral)   Resp 17   Ht 5\' 11"  (1.803 m)   Wt 225 lb 15.5 oz (102.5 kg)   SpO2 94%   BMI 31.52 kg/m  Exam: Physical Exam  Constitutional: He is well-developed, well-nourished, and in no distress.  Elderly gentleman, NAD,  HENT:  Head: Normocephalic and atraumatic.  Nose: Nose normal.  Mouth/Throat: Oropharynx is clear and moist.  Eyes: Pupils are equal, round, and reactive to light.  Neck: Normal range of motion. Neck supple.  Cardiovascular: Normal rate, regular rhythm and normal heart sounds.  Pulmonary/Chest: Effort normal.  Rhonchorous bibasilar lungs,  Abdominal: Soft. Bowel sounds are normal.  Large scar from laparotomy   Musculoskeletal: Normal range of motion.  Neurological: He is alert.  Orientated x1, patient able to give his name, not able to tell us where he was or what year it was-stated the year was 1974  Skin: Skin is warm and dry.    Labs and Imaging: CBC BMET  Recent Labs  Lab 03/24/17 1953  WBC 10.8*  HGB 14.6  HCT 43.1   PLT 188   Recent Labs  Lab 03/24/17 1133 03/24/17 1953  NA 138  --   K 3.6  --   CL 110  --   CO2 22  --   BUN 12  --   CREATININE 1.31* 1.10  GLUCOSE 109*  --   CALCIUM 9.0  --      Dg Chest 2 View  Result Date: 03/24/2017 CLINICAL DATA:  76 year old male with right lower quadrant pain for 2 days. EXAM: CHEST  2 VIEW COMPARISON:  None. FINDINGS: Cardiomediastinal silhouette is normal in size and configuration. There are mild changes of COPD with prominence of the interstitium and bibasilar atelectasis. No focal parenchymal opacity, pleural effusion or pneumothorax. No acute osseous abnormality. IMPRESSION: Chronic emphysematous/ COPD changes with bibasilar atelectasis. No acute intrathoracic process. Electronically Signed   By: Kristopher Oppenheim M.D.   On: 03/24/2017 12:38   Ct Abdomen Pelvis W Contrast  Result Date: 03/24/2017 CLINICAL DATA:  Acute right lower quadrant abdominal pain. EXAM: CT ABDOMEN AND PELVIS WITH CONTRAST TECHNIQUE: Multidetector CT imaging of the abdomen and pelvis was performed using the standard protocol following bolus administration of intravenous contrast. CONTRAST:  100 mL ISOVUE-300 IOPAMIDOL (ISOVUE-300) INJECTION 61% COMPARISON:  None. FINDINGS: Lower chest: No acute abnormality. Hepatobiliary: Cholelithiasis is noted. Multiple hepatic low densities are noted most consistent with cysts in the absence of any history of malignancy. Pancreas: Unremarkable. No pancreatic ductal dilatation or surrounding inflammatory changes. Spleen: Normal in size without focal abnormality. Adrenals/Urinary Tract: Adrenal glands appear normal. Bilateral renal cysts are noted. No hydronephrosis or renal obstruction is noted. No renal or ureteral calculi are noted. Urinary bladder appears normal. Stomach/Bowel: Large amount of stool is noted in the rectum. Stomach appears normal. The appendix appears normal. There is no evidence of bowel obstruction. Wall thickening and surrounding  inflammatory changes are seen involving several small bowel loops in the central abdomen most consistent with focal enteritis. 33 x 22 x 16 mm fluid collection is seen between the small bowel loops concerning for small abscess. Vascular/Lymphatic: Aortic atherosclerosis. No enlarged abdominal or pelvic lymph nodes. Reproductive: Surgical clips are noted in prostatic bed. Patient appears to be status post prostatectomy. Other: No abdominal wall hernia or abnormality. No abdominopelvic ascites. Musculoskeletal: No  acute or significant osseous findings. IMPRESSION: Cholelithiasis. Multiple hepatic cysts. Aortic atherosclerosis. Inflamed small bowel loops are noted anteriorly in the central abdomen most consistent with focal enteritis. 3.3 x 2.2 x 1.6 cm fluid collection is seen between the small bowel loops concerning for small abscess. Electronically Signed   By: Marijo Conception, M.D.   On: 03/24/2017 13:00   Tonette Bihari, MD 03/24/2017, 9:24 PM PGY-3, [place] [facility] pager: [phone], text pages welcome

## 2017-03-24 NOTE — ED Notes (Signed)
ED Provider at bedside. 

## 2017-03-24 NOTE — ED Triage Notes (Signed)
Patient c/o RLQ pain onset of Friday night with N/V/D. BM this am. Pt reports episode of emesis yesterday mooring while eating breakfast. Family adds patient has had a cough for a long time but their PCP has not done anything about it.

## 2017-03-24 NOTE — ED Provider Notes (Signed)
[name] Note  CSN: [id number] Arrival date & time: 03/24/17 1015  Chief Complaint(s) Abdominal Pain  HPI Charles Chan is a 76 y.o. male   The history is provided by the patient.  Abdominal Pain   This is a new problem. Episode onset: 3 days. The problem occurs constantly. The problem has been gradually worsening. The pain is associated with an unknown factor. The pain is located in the RLQ and periumbilical region. The pain is moderate. Associated symptoms include nausea and vomiting (NBNB). Pertinent negatives include fever, diarrhea, constipation, dysuria and frequency. The symptoms are aggravated by palpation. Nothing relieves the symptoms.   Remainder of history, ROS, and physical exam limited due to patient's condition (Dementia). Additional information was obtained from [facility].   Level V Caveat.    Past Medical History Past Medical History:  Diagnosis Date  . Ambulates with cane 11/22/2016   In the 90s, patient injured his knee (ligament tear?) and declined surgery. Since then he has been using a cane to ambulate. Reports he is not in pain.   Marland Kitchen CAD (coronary artery disease) 11/22/2016   With Stent in 1998  . CHF (congestive heart failure) ([place]) 11/22/2016   Diagnosed in 1998  . Dementia 11/23/2016  . Former smoker 11/22/2016   Quit 1980s  . H/O partial resection of colon 11/22/2016   Due to polyp in 2004   . History of alcohol abuse 11/22/2016   1970s  . History of MI (myocardial infarction) 11/22/2016   2001  . History of prostate cancer 11/22/2016   S/p radiation in 2015? Was followed by previous oncologist yearly (Dr. Nona Dell in [place])   . History of stroke 11/22/2016  . HLD (hyperlipidemia) 11/22/2016  . HTN (hypertension) 11/22/2016  . Irregular cardiac rhythm 11/22/2016  . OSA (obstructive sleep apnea) 11/22/2016   History of OSA. Has not required CPAP since May 2018   Patient Active Problem List   Diagnosis  Date Noted  . Atrial flutter ([place]) 11/26/2016  . Dementia 11/23/2016  . Ambulates with cane 11/22/2016  . History of stroke 11/22/2016  . OSA (obstructive sleep apnea) 11/22/2016  . HTN (hypertension) 11/22/2016  . HLD (hyperlipidemia) 11/22/2016  . History of MI (myocardial infarction) 11/22/2016  . CHF (congestive heart failure) ([place]) 11/22/2016  . History of prostate cancer 11/22/2016  . History of alcohol abuse 11/22/2016  . CAD (coronary artery disease) 11/22/2016  . H/O partial resection of colon 11/22/2016  . Former smoker 11/22/2016  . Irregular cardiac rhythm 11/22/2016   Home Medication(s) Prior to Admission medications   Medication Sig Start Date End Date Taking? Authorizing Provider  amLODipine (NORVASC) 10 MG tablet Take 5 mg by mouth daily.    [provider]  aspirin 325 MG EC tablet Take 325 mg by mouth daily.    [provider]  cyanocobalamin (,VITAMIN B-12,) 1000 MCG/ML injection INJECT 1 ML INTRAMUSCULARLY ONCE A MONTH 11/27/16   [provider]  cyanocobalamin 1000 MCG tablet Take 1,000 mcg by mouth every 30 (thirty) days.    [provider]  metoprolol succinate (TOPROL-XL) 25 MG 24 hr tablet Take 25 mg by mouth daily.    [provider]  nitroGLYCERIN (NITROSTAT) 0.4 MG SL tablet Place 0.4 mg under the tongue every 5 (five) minutes as needed for chest pain.    [provider]  simvastatin (ZOCOR) 20 MG tablet Take 20 mg by mouth daily at 6 PM.    [provider]  Past Surgical History Past Surgical History:  Procedure Laterality Date  . COLON RESECTION  2004  . CORONARY ANGIOPLASTY WITH STENT PLACEMENT     Family History Family History  Problem Relation Age of Onset  . Hyperlipidemia Mother   . Hypertension Mother   . Alcohol abuse Father   . Drug abuse Father   .  Heart disease Father        passed away in his 12s due to MI  . Asthma Sister   . Healthy Brother     Social History Social History   Tobacco Use  . Smoking status: Former Smoker    Last attempt to quit: 1980    Years since quitting: 38.9  . Smokeless tobacco: Never Used  Substance Use Topics  . Alcohol use: No  . Drug use: No   Allergies Shellfish allergy  Review of Systems Review of Systems  Unable to perform ROS: Dementia  Constitutional: Negative for fever.  Respiratory: Positive for cough.   Gastrointestinal: Positive for abdominal pain, nausea and vomiting (NBNB). Negative for constipation and diarrhea.  Endocrine: Positive for cold intolerance.  Genitourinary: Negative for dysuria and frequency.  All other systems are reviewed and are negative for acute change except as noted in the HPI   Physical Exam Vital Signs  I have reviewed the triage vital signs BP (!) 177/76 (BP Location: Left Arm)   Pulse 95   Temp (!) 97.5 F (36.4 C) (Oral)   Resp 17   SpO2 97%   Physical Exam  Constitutional: He appears well-developed and well-nourished. No distress.  HENT:  Head: Normocephalic and atraumatic.  Nose: Nose normal.  Eyes: Conjunctivae and EOM are normal. Pupils are equal, round, and reactive to light. Right eye exhibits no discharge. Left eye exhibits no discharge. No scleral icterus.  Neck: Normal range of motion. Neck supple.  Cardiovascular: Normal rate and regular rhythm. Exam reveals no gallop and no friction rub.  No murmur heard. Pulmonary/Chest: Effort normal and breath sounds normal. No stridor. No respiratory distress. He has no rales.  Abdominal: Soft. He exhibits no distension. There is tenderness in the right lower quadrant and periumbilical area. There is no rigidity, no rebound, no guarding and no CVA tenderness.  Musculoskeletal: He exhibits no edema or tenderness.  Neurological: He is alert. He is disoriented (oriented to person and place, not  time (basline per family)).  Skin: Skin is warm and dry. No rash noted. He is not diaphoretic. No erythema.  Psychiatric: He has a normal mood and affect.  Vitals reviewed.   ED Results and Treatments Labs (all labs ordered are listed, but only abnormal results are displayed) Labs Reviewed  COMPREHENSIVE METABOLIC PANEL - Abnormal; Notable for the following components:      Result Value   Glucose, Bld 109 (*)    Creatinine, Ser 1.31 (*)    ALT 11 (*)    GFR calc non Af Amer 52 (*)    All other components within normal limits  CBC - Abnormal; Notable for the following components:   WBC 13.4 (*)    All other components within normal limits  URINALYSIS, ROUTINE W REFLEX MICROSCOPIC - Abnormal; Notable for the following components:   Specific Gravity, Urine >1.046 (*)    All other components within normal limits  LIPASE, BLOOD  EKG  EKG Interpretation  Date/Time:    Ventricular Rate:    PR Interval:    QRS Duration:   QT Interval:    QTC Calculation:   R Axis:     Text Interpretation:        Radiology Dg Chest 2 View  Result Date: 03/24/2017 CLINICAL DATA:  76 year old male with right lower quadrant pain for 2 days. EXAM: CHEST  2 VIEW COMPARISON:  None. FINDINGS: Cardiomediastinal silhouette is normal in size and configuration. There are mild changes of COPD with prominence of the interstitium and bibasilar atelectasis. No focal parenchymal opacity, pleural effusion or pneumothorax. No acute osseous abnormality. IMPRESSION: Chronic emphysematous/ COPD changes with bibasilar atelectasis. No acute intrathoracic process. Electronically Signed   By: Kristopher Oppenheim M.D.   On: 03/24/2017 12:38   Ct Abdomen Pelvis W Contrast  Result Date: 03/24/2017 CLINICAL DATA:  Acute right lower quadrant abdominal pain. EXAM: CT ABDOMEN AND PELVIS WITH CONTRAST TECHNIQUE:  Multidetector CT imaging of the abdomen and pelvis was performed using the standard protocol following bolus administration of intravenous contrast. CONTRAST:  100 mL ISOVUE-300 IOPAMIDOL (ISOVUE-300) INJECTION 61% COMPARISON:  None. FINDINGS: Lower chest: No acute abnormality. Hepatobiliary: Cholelithiasis is noted. Multiple hepatic low densities are noted most consistent with cysts in the absence of any history of malignancy. Pancreas: Unremarkable. No pancreatic ductal dilatation or surrounding inflammatory changes. Spleen: Normal in size without focal abnormality. Adrenals/Urinary Tract: Adrenal glands appear normal. Bilateral renal cysts are noted. No hydronephrosis or renal obstruction is noted. No renal or ureteral calculi are noted. Urinary bladder appears normal. Stomach/Bowel: Large amount of stool is noted in the rectum. Stomach appears normal. The appendix appears normal. There is no evidence of bowel obstruction. Wall thickening and surrounding inflammatory changes are seen involving several small bowel loops in the central abdomen most consistent with focal enteritis. 33 x 22 x 16 mm fluid collection is seen between the small bowel loops concerning for small abscess. Vascular/Lymphatic: Aortic atherosclerosis. No enlarged abdominal or pelvic lymph nodes. Reproductive: Surgical clips are noted in prostatic bed. Patient appears to be status post prostatectomy. Other: No abdominal wall hernia or abnormality. No abdominopelvic ascites. Musculoskeletal: No acute or significant osseous findings. IMPRESSION: Cholelithiasis. Multiple hepatic cysts. Aortic atherosclerosis. Inflamed small bowel loops are noted anteriorly in the central abdomen most consistent with focal enteritis. 3.3 x 2.2 x 1.6 cm fluid collection is seen between the small bowel loops concerning for small abscess. Electronically Signed   By: Marijo Conception, M.D.   On: 03/24/2017 13:00   Pertinent labs & imaging results that were available  during my care of the patient were reviewed by me and considered in my medical decision making (see chart for details).  Medications Ordered in ED Medications  sodium chloride 0.9 % bolus 1,000 mL (0 mLs Intravenous Stopped 03/24/17 1416)  morphine 4 MG/ML injection 4 mg (4 mg Intravenous Given 03/24/17 1242)  ondansetron (ZOFRAN) injection 4 mg (4 mg Intravenous Given 03/24/17 1242)  iopamidol (ISOVUE-300) 61 % injection (100 mLs Intravenous Contrast Given 03/24/17 1220)  Procedures Procedures  (including critical care time)  Medical Decision Making / ED Course I have reviewed the nursing notes for this encounter and the patient's prior records (if available in EHR or on provided paperwork).    Workup consistent with enteritis with adjacent fluid collection concerning for small abscess.  Started on Zosyn.  Case discussed with family medicine who will admit the patient.  Will touch base with surgery for their evaluation and recommendations.  Final Clinical Impression(s) / ED Diagnoses Final diagnoses:  Cough  Enteritis  Intestinal abscess      This chart was dictated using voice recognition software.  Despite best efforts to proofread,  errors can occur which can change the documentation meaning.   Fatima Blank, MD 03/24/17 [phone]

## 2017-03-25 DIAGNOSIS — Z8673 Personal history of transient ischemic attack (TIA), and cerebral infarction without residual deficits: Secondary | ICD-10-CM | POA: Diagnosis not present

## 2017-03-25 DIAGNOSIS — K529 Noninfective gastroenteritis and colitis, unspecified: Secondary | ICD-10-CM | POA: Diagnosis present

## 2017-03-25 DIAGNOSIS — J439 Emphysema, unspecified: Secondary | ICD-10-CM | POA: Diagnosis present

## 2017-03-25 DIAGNOSIS — Z87891 Personal history of nicotine dependence: Secondary | ICD-10-CM | POA: Diagnosis not present

## 2017-03-25 DIAGNOSIS — G4733 Obstructive sleep apnea (adult) (pediatric): Secondary | ICD-10-CM | POA: Diagnosis present

## 2017-03-25 DIAGNOSIS — N179 Acute kidney failure, unspecified: Secondary | ICD-10-CM | POA: Diagnosis present

## 2017-03-25 DIAGNOSIS — Z7982 Long term (current) use of aspirin: Secondary | ICD-10-CM | POA: Diagnosis not present

## 2017-03-25 DIAGNOSIS — Z8546 Personal history of malignant neoplasm of prostate: Secondary | ICD-10-CM | POA: Diagnosis not present

## 2017-03-25 DIAGNOSIS — K630 Abscess of intestine: Secondary | ICD-10-CM | POA: Diagnosis present

## 2017-03-25 DIAGNOSIS — I4892 Unspecified atrial flutter: Secondary | ICD-10-CM | POA: Diagnosis present

## 2017-03-25 DIAGNOSIS — Z85038 Personal history of other malignant neoplasm of large intestine: Secondary | ICD-10-CM | POA: Diagnosis not present

## 2017-03-25 DIAGNOSIS — I110 Hypertensive heart disease with heart failure: Secondary | ICD-10-CM | POA: Diagnosis present

## 2017-03-25 DIAGNOSIS — I2510 Atherosclerotic heart disease of native coronary artery without angina pectoris: Secondary | ICD-10-CM | POA: Diagnosis present

## 2017-03-25 DIAGNOSIS — I252 Old myocardial infarction: Secondary | ICD-10-CM | POA: Diagnosis not present

## 2017-03-25 DIAGNOSIS — R109 Unspecified abdominal pain: Secondary | ICD-10-CM | POA: Diagnosis present

## 2017-03-25 DIAGNOSIS — Z955 Presence of coronary angioplasty implant and graft: Secondary | ICD-10-CM | POA: Diagnosis not present

## 2017-03-25 DIAGNOSIS — E785 Hyperlipidemia, unspecified: Secondary | ICD-10-CM | POA: Diagnosis present

## 2017-03-25 DIAGNOSIS — I509 Heart failure, unspecified: Secondary | ICD-10-CM | POA: Diagnosis present

## 2017-03-25 DIAGNOSIS — F0390 Unspecified dementia without behavioral disturbance: Secondary | ICD-10-CM | POA: Diagnosis not present

## 2017-03-25 DIAGNOSIS — Z79899 Other long term (current) drug therapy: Secondary | ICD-10-CM | POA: Diagnosis not present

## 2017-03-25 LAB — CBC
HCT: 42.5 % (ref 39.0–52.0)
HCT: 41.7 % (ref 39.0–52.0)
HEMOGLOBIN: 14.1 g/dL (ref 13.0–17.0)
Hemoglobin: 14.7 g/dL (ref 13.0–17.0)
MCH: 32.2 pg (ref 26.0–34.0)
MCH: 31.5 pg (ref 26.0–34.0)
MCHC: 34.6 g/dL (ref 30.0–36.0)
MCHC: 33.8 g/dL (ref 30.0–36.0)
MCV: 93 fL (ref 78.0–100.0)
MCV: 93.1 fL (ref 78.0–100.0)
PLATELETS: 170 10*3/uL (ref 150–400)
PLATELETS: 193 10*3/uL (ref 150–400)
RBC: 4.57 MIL/uL (ref 4.22–5.81)
RBC: 4.48 MIL/uL (ref 4.22–5.81)
RDW: 13.4 % (ref 11.5–15.5)
RDW: 13 % (ref 11.5–15.5)
WBC: 8.6 10*3/uL (ref 4.0–10.5)
WBC: 7.8 10*3/uL (ref 4.0–10.5)

## 2017-03-25 LAB — GLUCOSE, CAPILLARY: GLUCOSE-CAPILLARY: 109 mg/dL — AB (ref 65–99)

## 2017-03-25 LAB — BASIC METABOLIC PANEL
Anion gap: 7 (ref 5–15)
BUN: 9 mg/dL (ref 6–20)
CALCIUM: 8.6 mg/dL — AB (ref 8.9–10.3)
CO2: 22 mmol/L (ref 22–32)
CREATININE: 1.16 mg/dL (ref 0.61–1.24)
Chloride: 110 mmol/L (ref 101–111)
Glucose, Bld: 89 mg/dL (ref 65–99)
Potassium: 3.6 mmol/L (ref 3.5–5.1)
SODIUM: 139 mmol/L (ref 135–145)

## 2017-03-25 LAB — PROTIME-INR
INR: 1.09
PROTHROMBIN TIME: 14 s (ref 11.4–15.2)

## 2017-03-25 LAB — APTT: aPTT: 31 seconds (ref 24–36)

## 2017-03-25 MED ORDER — SODIUM CHLORIDE 0.45 % IV SOLN: INTRAVENOUS | Status: DC

## 2017-03-25 MED ORDER — DEXTROSE-NACL 5-0.45 % IV SOLN
INTRAVENOUS | Status: DC
  Administered 2017-03-25 (×2): via INTRAVENOUS

## 2017-03-25 NOTE — Evaluation (Signed)
Occupational Therapy Evaluation Patient Details Name: Charles Chan MRN: [id number] DOB: 28-May-1940 Today's Date: 03/25/2017    History of Present Illness 76 y.o. male presenting with 2 days of acute abdominal pain. PMH is significant for significant for hx of colon cancer, hx of prostate cancer, atrial flutter, HLD, hypertension, hx of stroke, CAD, dementia   Clinical Impression   Pt with decline in function and safety with ADLs and ADL mobility with decreased balance and endurance. PTA, pt's wife providing set up/sup with ADLs and ADL mobility. Pt would benefit from acute OT services to address impairments to improve level of function and safety    Follow Up Recommendations  No OT follow up    Equipment Recommendations  Tub/shower bench    Recommendations for Other Services       Precautions / Restrictions Precautions Precautions: Fall Precaution Comments: generalized weakness Restrictions Weight Bearing Restrictions: No      Mobility Bed Mobility Overal bed mobility: Needs Assistance Bed Mobility: Sit to Supine       Sit to supine: Supervision   General bed mobility comments: OOB in recliner upon arrival, assisted back to bed with sup and cues for safety/sequencing, no physical assist  Transfers Overall transfer level: Needs assistance Equipment used: Rolling walker (2 wheeled);Straight cane Transfers: Sit to/from Stand Sit to Stand: Min guard         General transfer comment: min guard with power up, cues for safety and hand placment with RW. 1x with spc, allthough feel RW is safer for patient, family and patient agree.    Balance Overall balance assessment: Needs assistance Sitting-balance support: Feet supported;No upper extremity supported Sitting balance-Leahy Scale: Fair     Standing balance support: During functional activity;Bilateral upper extremity supported Standing balance-Leahy Scale: Fair                             ADL  either performed or assessed with clinical judgement   ADL Overall ADL's : Needs assistance/impaired     Grooming: Wash/dry hands;Wash/dry face;Standing;Min guard   Upper Body Bathing: Set up;Supervision/ safety;Sitting   Lower Body Bathing: Min guard;With caregiver independent assisting;Sit to/from stand;Cueing for safety   Upper Body Dressing : Set up;Supervision/safety;Sitting   Lower Body Dressing: With caregiver independent assisting;Sit to/from stand;Min guard;Cueing for safety   Toilet Transfer: Min guard;RW;Grab bars;Comfort height toilet;Ambulation;With caregiver independent assisting   Toileting- Water quality scientist and Hygiene: Min guard;Sit to/from stand;With caregiver independent assisting     Tub/Shower Transfer Details (indicate cue type and reason): pt has tub shower at home Functional mobility during ADLs: Min guard General ADL Comments: educated on use of tub bench for home, will demo and have pt try next session     Vision Baseline Vision/History: Wears glasses Patient Visual Report: No change from baseline       Perception     Praxis      Pertinent Vitals/Pain Pain Assessment: No/denies pain     Hand Dominance Right   Extremity/Trunk Assessment Upper Extremity Assessment Upper Extremity Assessment: Generalized weakness   Lower Extremity Assessment Lower Extremity Assessment: Defer to PT evaluation       Communication Communication Communication: No difficulties   Cognition Arousal/Alertness: Awake/alert Behavior During Therapy: WFL for tasks assessed/performed Overall Cognitive Status: History of cognitive impairments - at baseline  General Comments: patient able to follow commands 75% of the time. suspect some processing deficts. hx of stroke in 2005.  Pt very pleasant and cooperative   General Comments  SpO2= 86-95% throughout session with activity. Cued for breathing techiqnues and symptom  monitoring. patient denies any smyptoms.     Exercises     Shoulder Instructions      Home Living Family/patient expects to be discharged to:: Private residence Living Arrangements: Spouse/significant other Available Help at Discharge: Family;Available 24 hours/day Type of Home: House Home Access: Stairs to enter [employer] of Steps: 2-6 stairs depending on which entrance they use Entrance Stairs-Rails: Can reach both Home Layout: One level     Bathroom Shower/Tub: Tub only   Biochemist, clinical: Standard     Home Equipment: Cane - single point   Additional Comments: Wife has assisted patient for many years now. reports no concerns with continuing to do so.       Prior Functioning/Environment Level of Independence: Needs assistance  Gait / Transfers Assistance Needed: Mod I- Min Guard with use of SPC and wife guarding. able to ambulate out in the community  ADL's / Homemaking Assistance Needed: wife has been assisting with ADL's by set up and sup, although reports patient is able to dress, toilet and bath himself.             OT Problem List: Decreased safety awareness;Decreased cognition;Decreased knowledge of use of DME or AE;Decreased knowledge of precautions;Impaired balance (sitting and/or standing)      OT Treatment/Interventions: Self-care/ADL training;Therapeutic activities;DME and/or AE instruction;Patient/family education    OT Goals(Current goals can be found in the care plan section) Acute Rehab OT Goals Patient Stated Goal: to return home with HHPT OT Goal Formulation: With patient/family Time For Goal Achievement: 04/01/17 Potential to Achieve Goals: Good ADL Goals Pt Will Perform Grooming: with set-up;with supervision;standing;with caregiver independent in assisting Pt Will Perform Lower Body Bathing: with supervision;with set-up;with caregiver independent in assisting Pt Will Perform Lower Body Dressing: with supervision;with set-up;with  caregiver independent in assisting Pt Will Transfer to Toilet: with supervision;ambulating;grab bars;regular height toilet Pt Will Perform Toileting - Clothing Manipulation and hygiene: with supervision;with caregiver independent in assisting Pt Will Perform Tub/Shower Transfer: with min guard assist;with supervision;with caregiver independent in assisting;3 in 1;tub bench  OT Frequency: Min 1X/week   Barriers to D/C:    no barriers       Co-evaluation              AM-PAC PT "6 Clicks" Daily Activity     Outcome Measure Help from another person eating meals?: None Help from another person taking care of personal grooming?: A Little Help from another person toileting, which includes using toliet, bedpan, or urinal?: A Little Help from another person bathing (including washing, rinsing, drying)?: A Little Help from another person to put on and taking off regular upper body clothing?: None Help from another person to put on and taking off regular lower body clothing?: A Little 6 Click Score: 20   End of Session Equipment Utilized During Treatment: Gait belt;Rolling walker  Activity Tolerance: Patient tolerated treatment well Patient left: in bed;with call bell/phone within reach;with bed alarm set;with family/visitor present  OT Visit Diagnosis: Unsteadiness on feet (R26.81);History of falling (Z91.81);Muscle weakness (generalized) (M62.81);Other symptoms and signs involving cognitive function                Time: [id number] OT Time Calculation (min): 27 min Charges:  OT General Charges $OT  Visit: 1 Visit OT Evaluation $OT Eval Moderate Complexity: 1 Mod G-Codes: OT G-codes **NOT FOR INPATIENT CLASS** Functional Assessment Tool Used: AM-PAC 6 Clicks Daily Activity Functional Limitation: Other OT primary Other OT Primary Current Status (H7897): At least 1 percent but less than 20 percent impaired, limited or restricted Other OT Primary Goal Status (O4784): At least 20 percent  but less than 40 percent impaired, limited or restricted Other OT Primary Discharge Status [phone]): At least 20 percent but less than 40 percent impaired, limited or restricted     Britt Bottom 03/25/2017, 1:33 PM

## 2017-03-25 NOTE — Evaluation (Signed)
Physical Therapy Evaluation Patient Details Name: Charles Chan MRN: [id number] DOB: 08/24/1940 Today's Date: 03/25/2017   History of Present Illness  76 y.o. male presenting with 2 days of acute abdominal pain. PMH is significant for significant for hx of colon cancer, hx of prostate cancer, atrial flutter, HLD, hypertension, hx of stroke, CAD, dementia    Clinical Impression  Pt admitted with above diagnosis. Pt currently with functional limitations due to the deficits listed below (see PT Problem List). PTA, pt was mod I with mobility, utilizing SPC and guarding from wife during ambulation. Pt lives in 1 story home with 2 stairs to enter home. Wife has been primarily assisting patient along with family members who live nearby with some ADLs for past several years, and feels confident she can continue to do so upon discharge. Currently, pt presents with weakness and balance deficits that limit his safety with functional mobility. Min Guard for transfers and gait, recommending use of RW to maximize safety. Plan next visit is to stair train before being safe to d/c home once medically cleared.  Pt will benefit from skilled PT to increase their independence and safety with mobility to allow discharge to the venue listed below.       Follow Up Recommendations Home health PT;Supervision/Assistance - 24 hour    Equipment Recommendations  Rolling walker with 5" wheels    Recommendations for Other Services       Precautions / Restrictions Precautions Precautions: Fall Precaution Comments: generalized weakness Restrictions Weight Bearing Restrictions: No      Mobility  Bed Mobility               General bed mobility comments: OOB at entry  Transfers Overall transfer level: Needs assistance Equipment used: Rolling walker (2 wheeled);Straight cane Transfers: Sit to/from Stand Sit to Stand: Min guard         General transfer comment: min guard with power up, cues for  safety and hand placment with RW. 1x with spc, allthough feel RW is safer for patient, family and patient agree.  Ambulation/Gait Ambulation/Gait assistance: Min guard Ambulation Distance (Feet): 75 Feet Assistive device: Rolling walker (2 wheeled) Gait Pattern/deviations: Step-to pattern;Step-through pattern Gait velocity: decreased   General Gait Details: Cues for safety with RW and to keep within safe distance. disucssed with mobility tech for future sessions. Pt and family agree RW is safer for functional mobility at this time.   Stairs            Wheelchair Mobility    Modified Rankin (Stroke Patients Only)       Balance Overall balance assessment: Needs assistance Sitting-balance support: Feet supported;No upper extremity supported Sitting balance-Leahy Scale: Fair     Standing balance support: During functional activity;Bilateral upper extremity supported Standing balance-Leahy Scale: Fair                               Pertinent Vitals/Pain Pain Assessment: No/denies pain    Home Living Family/patient expects to be discharged to:: Private residence Living Arrangements: Spouse/significant other Available Help at Discharge: Family;Available 24 hours/day(Lives with Wife and has other family nearby. ) Type of Home: House Home Access: Stairs to enter Entrance Stairs-Rails: Can reach both Entrance Stairs-Number of Steps: 2-6 stairs depending on which entrance they use Home Layout: One level Home Equipment: Cane - single point Additional Comments: Wife has assisted patient for many years now. reports no concerns with continuing to do  so.     Prior Function Level of Independence: Needs assistance   Gait / Transfers Assistance Needed: Mod I- Min Guard with use of SPC and wife guarding. able to ambulate out in the community   ADL's / Homemaking Assistance Needed: wife has been assisting with ADL's, although reports patient is able to dress and bath  himself.         Hand Dominance        Extremity/Trunk Assessment   Upper Extremity Assessment Upper Extremity Assessment: Defer to OT evaluation    Lower Extremity Assessment Lower Extremity Assessment: (RLE 4/5 strength globally, LLE strength 3+/5. )       Communication   Communication: No difficulties  Cognition Arousal/Alertness: Awake/alert Behavior During Therapy: WFL for tasks assessed/performed Overall Cognitive Status: History of cognitive impairments - at baseline                                 General Comments: patient able to follow commands 75% of the time. suspect some processing deficts. hx of stroke in 2005.       General Comments General comments (skin integrity, edema, etc.): SpO2= 86-95% throughout session with activity. Cued for breathing techiqnues and symptom monitoring. patient denies any smyptoms.     Exercises     Assessment/Plan    PT Assessment Patient needs continued PT services  PT Problem List Decreased strength;Decreased activity tolerance;Decreased balance;Decreased mobility;Decreased knowledge of use of DME;Decreased cognition;Decreased coordination;Decreased safety awareness       PT Treatment Interventions DME instruction;Gait training;Stair training;Functional mobility training;Therapeutic activities;Therapeutic exercise;Balance training    PT Goals (Current goals can be found in the Care Plan section)  Acute Rehab PT Goals Patient Stated Goal: to return home with HHPT PT Goal Formulation: With patient/family Time For Goal Achievement: 04/01/17 Potential to Achieve Goals: Good    Frequency Min 3X/week   Barriers to discharge        Co-evaluation               AM-PAC PT "6 Clicks" Daily Activity  Outcome Measure Difficulty turning over in bed (including adjusting bedclothes, sheets and blankets)?: A Little Difficulty moving from lying on back to sitting on the side of the bed? : A Little Difficulty  sitting down on and standing up from a chair with arms (e.g., wheelchair, bedside commode, etc,.)?: A Little Help needed moving to and from a bed to chair (including a wheelchair)?: A Little Help needed walking in hospital room?: A Little Help needed climbing 3-5 steps with a railing? : A Little 6 Click Score: 18    End of Session Equipment Utilized During Treatment: Gait belt Activity Tolerance: Patient tolerated treatment well Patient left: in chair;with call bell/phone within reach;with family/visitor present Nurse Communication: Mobility status PT Visit Diagnosis: Unsteadiness on feet (R26.81);Other abnormalities of gait and mobility (R26.89);Muscle weakness (generalized) (M62.81);Repeated falls (R29.6);Difficulty in walking, not elsewhere classified (R26.2)    Time: 1135-1208 PT Time Calculation (min) (ACUTE ONLY): 33 min   Charges:   PT Evaluation $PT Eval Moderate Complexity: 1 Mod PT Treatments $Gait Training: 8-22 mins   PT G Codes:   PT G-Codes **NOT FOR INPATIENT CLASS** Functional Assessment Tool Used: AM-PAC 6 Clicks Basic Mobility;Clinical judgement Functional Limitation: Mobility: Walking and moving around Mobility: Walking and Moving Around Current Status (O9629): At least 40 percent but less than 60 percent impaired, limited or restricted Mobility: Walking and Moving Around Goal  Status [phone]): At least 20 percent but less than 40 percent impaired, limited or restricted    Reinaldo Berber, PT, DPT Acute Rehab Services Pager: [phone]    Reinaldo Berber 03/25/2017, 12:25 PM

## 2017-03-25 NOTE — Progress Notes (Signed)
  Dr. Anselm Pancoast reviewed CT images.  There is a tiny air-fluid collection between loops of bowel.  Very small. Recommend just follow for now.  Judie Grieve Joy Haegele PA-C 03/25/2017 4:26 PM

## 2017-03-25 NOTE — Progress Notes (Signed)
Family Medicine Teaching Service Daily Progress Note Intern Pager: [phone]  Patient name: Charles Chan Medical record number: [id number] Date of birth: Nov 20, 1940 Age: 76 y.o. Gender: male  Primary Care Provider: Smiley Houseman, MD Consultants: Surgery, IR Code Status: DNI  Pt Overview and Major Events to Date:  Charles Chan is a 76 y.o. male presenting with 2 days of acute abdominal pain. PMH is significant for significant for hx of colon cancer, hx of prostate cancer, atrial flutter, HLD, hypertension, hx of stroke, CAD, dementia  Assessment and Plan:  Acute abdominal pain: Denies any abdominal pain this AM. Abdominal CT significant for focal enteritis and small abscess. WBC improved 13.4 > 8.6. Surgery recommended non-operative management and to consider IR percutaneous drainage of abscess. IR consulted.  -Will continue Zosyn (11/25 -) Will transition to PO abx after IR assessment - appreciate IR consult - appreciate surgery recommendations -CBC and BMP -NPO for now  -Zofran as needed for nausea  -strict I/Os  Cough: Lung exam improved today.Satting well on RA. Denies SOB. Chest x-ray significant for COPD/emphysema.  -Tessalon Perles as needed   Hypertension: Hypertenive to 160s/80s.  -Restart Norvasc and Lopressor 25 mg  AKI: Cr improved to 1.1 Baseline serum creatinine 1.2. Gentle fluid hydration while NPO.  -trend CR  Dementia: Patient alert and orientated x1.  Unable to tell us where he was or what year it was.  Per his wife this is his baseline.  Patient with no focal deficiency, moving all limbs equally -Will obtain PT /OT for patient  FEN/GI: NPO except sips w/ meds, D51/2NS @ 36mL/hr Prophylaxis: Lovenox  Disposition: hospitalization pending IR eval  Subjective:  Denies any abdominal pain, chest pain, SOB. Pleasant.   Objective: Temp:  [97.4 F (36.3 C)-98.3 F (36.8 C)] 98.3 F (36.8 C) (11/26 0459) Pulse Rate:  [77-95] 83 (11/26  0459) Resp:  [16-17] 17 (11/26 0459) BP: (147-177)/(60-90) 165/86 (11/26 0459) SpO2:  [94 %-97 %] 94 % (11/26 0459) Weight:  [225 lb 15.5 oz (102.5 kg)] 225 lb 15.5 oz (102.5 kg) (11/25 1729) Physical Exam: General: NAD, resting in bed Cardiovascular: rrr, no mrg Respiratory: improved. rhonci throughout, NWOB Abdomen: mild RLQ tenderness, soft, no rebound/guarding Extremities: no lower extremity edema, 2+dp  Laboratory: Recent Labs  Lab 03/24/17 1133 03/24/17 1953 03/25/17 0612  WBC 13.4* 10.8* 8.6  HGB 15.0 14.6 14.7  HCT 43.5 43.1 42.5  PLT 196 188 170   Recent Labs  Lab 03/24/17 1133 03/24/17 1953  NA 138  --   K 3.6  --   CL 110  --   CO2 22  --   BUN 12  --   CREATININE 1.31* 1.10  CALCIUM 9.0  --   PROT 7.1  --   BILITOT 1.0  --   ALKPHOS 78  --   ALT 11*  --   AST 17  --   GLUCOSE 109*  --      Bonnita Hollow, MD 03/25/2017, 8:56 AM PGY-1, [place] Intern pager: [phone], text pages welcome

## 2017-03-25 NOTE — Progress Notes (Signed)
[facility] Progress Note     Subjective: CC: abdominal pain  Patient alert but only oriented to self - history obtained from him and his wife.  Mr. Charles Chan is a 76 yo male with pmhx significant for dementia, hx of colon cancer, h/o prostate cancer, atrial flutter. He denies any problems overnight and states he does not have any abdominal pain at rest. He has passed flatus but no BMs during hospital stay. He states he has no appetite and denies any fever or chills. Admits to some abdominal distension.   Objective: Vital signs in last 24 hours: Temp:  [97.4 F (36.3 C)-98.3 F (36.8 C)] 98.3 F (36.8 C) (11/26 0459) Pulse Rate:  [77-95] 83 (11/26 0459) Resp:  [16-17] 17 (11/26 0459) BP: (147-177)/(60-90) 165/86 (11/26 0459) SpO2:  [94 %-97 %] 94 % (11/26 0459) Weight:  [102.5 kg (225 lb 15.5 oz)] 102.5 kg (225 lb 15.5 oz) (11/25 1729) Last BM Date: 03/24/17  Intake/Output from previous day: 11/25 0701 - 11/26 0700 In: 1300 [P.O.:200; IV Piggyback:1100] Out: 200 [Urine:200] Intake/Output this shift: No intake/output data recorded.  PE: Gen:  Alert, NAD, pleasant Card:  Regular rate and rhythm, pedal pulses 2+ BL Pulm:  Normal effort Abd: Soft, tender to palpation in RLQ, mildly distended, bowel sounds hyperactive in all 4 quadrants Skin: warm and dry, no rashes  Psych: A&Ox1   Lab Results:  Recent Labs    03/25/17 0612 03/25/17 0836  WBC 8.6 7.8  HGB 14.7 14.1  HCT 42.5 41.7  PLT 170 193   BMET Recent Labs    03/24/17 1133 03/24/17 1953 03/25/17 0836  NA 138  --  139  K 3.6  --  3.6  CL 110  --  110  CO2 22  --  22  GLUCOSE 109*  --  89  BUN 12  --  9  CREATININE 1.31* 1.10 1.16  CALCIUM 9.0  --  8.6*   PT/INR Recent Labs    03/25/17 0836  LABPROT 14.0  INR 1.09   CMP     Component Value Date/Time   NA 139 03/25/2017 0836   NA 143 11/22/2016 1233   K 3.6 03/25/2017 0836   CL 110 03/25/2017 0836   CO2 22 03/25/2017 0836    GLUCOSE 89 03/25/2017 0836   BUN 9 03/25/2017 0836   BUN 11 11/22/2016 1233   CREATININE 1.16 03/25/2017 0836   CALCIUM 8.6 (L) 03/25/2017 0836   PROT 7.1 03/24/2017 1133   ALBUMIN 3.5 03/24/2017 1133   AST 17 03/24/2017 1133   ALT 11 (L) 03/24/2017 1133   ALKPHOS 78 03/24/2017 1133   BILITOT 1.0 03/24/2017 1133   GFRNONAA >60 03/25/2017 0836   GFRAA >60 03/25/2017 0836   Lipase     Component Value Date/Time   LIPASE 21 03/24/2017 1133       Studies/Results: Dg Chest 2 View  Result Date: 03/24/2017 CLINICAL DATA:  76 year old male with right lower quadrant pain for 2 days. EXAM: CHEST  2 VIEW COMPARISON:  None. FINDINGS: Cardiomediastinal silhouette is normal in size and configuration. There are mild changes of COPD with prominence of the interstitium and bibasilar atelectasis. No focal parenchymal opacity, pleural effusion or pneumothorax. No acute osseous abnormality. IMPRESSION: Chronic emphysematous/ COPD changes with bibasilar atelectasis. No acute intrathoracic process. Electronically Signed   By: Kristopher Oppenheim M.D.   On: 03/24/2017 12:38   Ct Abdomen Pelvis W Contrast  Result Date: 03/24/2017 CLINICAL DATA:  Acute right  lower quadrant abdominal pain. EXAM: CT ABDOMEN AND PELVIS WITH CONTRAST TECHNIQUE: Multidetector CT imaging of the abdomen and pelvis was performed using the standard protocol following bolus administration of intravenous contrast. CONTRAST:  100 mL ISOVUE-300 IOPAMIDOL (ISOVUE-300) INJECTION 61% COMPARISON:  None. FINDINGS: Lower chest: No acute abnormality. Hepatobiliary: Cholelithiasis is noted. Multiple hepatic low densities are noted most consistent with cysts in the absence of any history of malignancy. Pancreas: Unremarkable. No pancreatic ductal dilatation or surrounding inflammatory changes. Spleen: Normal in size without focal abnormality. Adrenals/Urinary Tract: Adrenal glands appear normal. Bilateral renal cysts are noted. No hydronephrosis or  renal obstruction is noted. No renal or ureteral calculi are noted. Urinary bladder appears normal. Stomach/Bowel: Large amount of stool is noted in the rectum. Stomach appears normal. The appendix appears normal. There is no evidence of bowel obstruction. Wall thickening and surrounding inflammatory changes are seen involving several small bowel loops in the central abdomen most consistent with focal enteritis. 33 x 22 x 16 mm fluid collection is seen between the small bowel loops concerning for small abscess. Vascular/Lymphatic: Aortic atherosclerosis. No enlarged abdominal or pelvic lymph nodes. Reproductive: Surgical clips are noted in prostatic bed. Patient appears to be status post prostatectomy. Other: No abdominal wall hernia or abnormality. No abdominopelvic ascites. Musculoskeletal: No acute or significant osseous findings. IMPRESSION: Cholelithiasis. Multiple hepatic cysts. Aortic atherosclerosis. Inflamed small bowel loops are noted anteriorly in the central abdomen most consistent with focal enteritis. 3.3 x 2.2 x 1.6 cm fluid collection is seen between the small bowel loops concerning for small abscess. Electronically Signed   By: Marijo Conception, M.D.   On: 03/24/2017 13:00    Anti-infectives: Anti-infectives (From admission, onward)   Start     Dose/Rate Route Frequency Ordered Stop   03/24/17 2359  piperacillin-tazobactam (ZOSYN) IVPB 3.375 g     3.375 g 12.5 mL/hr over 240 Minutes Intravenous Every 8 hours 03/24/17 1603     03/24/17 1515  piperacillin-tazobactam (ZOSYN) IVPB 3.375 g     3.375 g 100 mL/hr over 30 Minutes Intravenous STAT 03/24/17 1503 03/24/17 1856       Assessment/Plan Abdominal pain: Enteritis and possible abscess per CT. Denies abdominal pain currently at rest but tender to palpation. - WBC: improved - 10.8>7.8 on IV abx - Continue IV fluids - IR consulted by attending for possible aspiration of small fluid collection - Zofran for nausea - Strict  I/Os  Will continue to follow.  FEN: NPO  DVT: Lovenox ID: Zosyn 11/25 >> Foley: Not in place at time of exam   Tonny Branch , PA-S2 03/25/2017, 10:09 AM

## 2017-03-26 ENCOUNTER — Other Ambulatory Visit: Payer: Self-pay

## 2017-03-26 LAB — CBC
HCT: 40.4 % (ref 39.0–52.0)
Hemoglobin: 13.6 g/dL (ref 13.0–17.0)
MCH: 31.2 pg (ref 26.0–34.0)
MCHC: 33.7 g/dL (ref 30.0–36.0)
MCV: 92.7 fL (ref 78.0–100.0)
PLATELETS: 201 10*3/uL (ref 150–400)
RBC: 4.36 MIL/uL (ref 4.22–5.81)
RDW: 12.9 % (ref 11.5–15.5)
WBC: 7.9 10*3/uL (ref 4.0–10.5)

## 2017-03-26 LAB — BASIC METABOLIC PANEL
ANION GAP: 6 (ref 5–15)
BUN: 6 mg/dL (ref 6–20)
CO2: 24 mmol/L (ref 22–32)
Calcium: 8.9 mg/dL (ref 8.9–10.3)
Chloride: 107 mmol/L (ref 101–111)
Creatinine, Ser: 1.23 mg/dL (ref 0.61–1.24)
GFR, EST NON AFRICAN AMERICAN: 56 mL/min — AB (ref >60)
GLUCOSE: 97 mg/dL (ref 65–99)
POTASSIUM: 4.7 mmol/L (ref 3.5–5.1)
SODIUM: 137 mmol/L (ref 135–145)

## 2017-03-26 MED ORDER — HYDRALAZINE HCL 20 MG/ML IJ SOLN
5.0000 mg | Freq: Once | INTRAMUSCULAR | Status: AC
  Administered 2017-03-26: 5 mg via INTRAVENOUS
  Filled 2017-03-26: qty 1

## 2017-03-26 MED ORDER — METRONIDAZOLE 500 MG PO TABS
500.0000 mg | ORAL_TABLET | Freq: Three times a day (TID) | ORAL | Status: DC
  Administered 2017-03-26 – 2017-03-27 (×4): 500 mg via ORAL
  Filled 2017-03-26 (×4): qty 1

## 2017-03-26 MED ORDER — CIPROFLOXACIN HCL 500 MG PO TABS
500.0000 mg | ORAL_TABLET | Freq: Two times a day (BID) | ORAL | Status: DC
  Administered 2017-03-26 – 2017-03-27 (×2): 500 mg via ORAL
  Filled 2017-03-26 (×2): qty 1

## 2017-03-26 NOTE — Progress Notes (Signed)
[facility] Progress Note     Subjective: CC: abdominal pain  Patient alert and oriented to self and place. History obtained from patient and his wife.  Charles Chan and his wife state that abdominal pain greatly improved with only mild soreness currently. He had one BM yesterday. Denies N/V/D, fever, chills, or urinary symptoms. He states he has an appetite this am.  Objective: Vital signs in last 24 hours: Temp:  [97.6 F (36.4 C)-98.5 F (36.9 C)] 97.6 F (36.4 C) (11/27 0640) Pulse Rate:  [79-92] 79 (11/27 0640) Resp:  [18] 18 (11/27 0640) BP: (133-152)/(86-90) 152/88 (11/27 0640) SpO2:  [93 %-98 %] 94 % (11/27 0640) Last BM Date: 03/24/17  Intake/Output from previous day: 11/26 0701 - 11/27 0700 In: 1857.9 [P.O.:30; I.V.:1677.9; IV Piggyback:150] Out: 2500 [Urine:2500] Intake/Output this shift: No intake/output data recorded.  PE: Gen:  Alert, NAD, pleasant Pulm:  Normal effort Abd: Soft, non-tender, non-distended, bowel sounds present in all 4 quadrants Skin: warm and dry, no rashes  Psych: A&Ox2   Lab Results:  Recent Labs    03/25/17 0836 03/26/17 0552  WBC 7.8 7.9  HGB 14.1 13.6  HCT 41.7 40.4  PLT 193 201   BMET Recent Labs    03/25/17 0836 03/26/17 0552  NA 139 137  K 3.6 4.7  CL 110 107  CO2 22 24  GLUCOSE 89 97  BUN 9 6  CREATININE 1.16 1.23  CALCIUM 8.6* 8.9   PT/INR Recent Labs    03/25/17 0836  LABPROT 14.0  INR 1.09   CMP     Component Value Date/Time   NA 137 03/26/2017 0552   NA 143 11/22/2016 1233   K 4.7 03/26/2017 0552   CL 107 03/26/2017 0552   CO2 24 03/26/2017 0552   GLUCOSE 97 03/26/2017 0552   BUN 6 03/26/2017 0552   BUN 11 11/22/2016 1233   CREATININE 1.23 03/26/2017 0552   CALCIUM 8.9 03/26/2017 0552   PROT 7.1 03/24/2017 1133   ALBUMIN 3.5 03/24/2017 1133   AST 17 03/24/2017 1133   ALT 11 (L) 03/24/2017 1133   ALKPHOS 78 03/24/2017 1133   BILITOT 1.0 03/24/2017 1133   GFRNONAA 56 (L)  03/26/2017 0552   GFRAA >60 03/26/2017 0552   Lipase     Component Value Date/Time   LIPASE 21 03/24/2017 1133       Studies/Results: Dg Chest 2 View  Result Date: 03/24/2017 CLINICAL DATA:  76 year old male with right lower quadrant pain for 2 days. EXAM: CHEST  2 VIEW COMPARISON:  None. FINDINGS: Cardiomediastinal silhouette is normal in size and configuration. There are mild changes of COPD with prominence of the interstitium and bibasilar atelectasis. No focal parenchymal opacity, pleural effusion or pneumothorax. No acute osseous abnormality. IMPRESSION: Chronic emphysematous/ COPD changes with bibasilar atelectasis. No acute intrathoracic process. Electronically Signed   By: Kristopher Oppenheim M.D.   On: 03/24/2017 12:38   Ct Abdomen Pelvis W Contrast  Result Date: 03/24/2017 CLINICAL DATA:  Acute right lower quadrant abdominal pain. EXAM: CT ABDOMEN AND PELVIS WITH CONTRAST TECHNIQUE: Multidetector CT imaging of the abdomen and pelvis was performed using the standard protocol following bolus administration of intravenous contrast. CONTRAST:  100 mL ISOVUE-300 IOPAMIDOL (ISOVUE-300) INJECTION 61% COMPARISON:  None. FINDINGS: Lower chest: No acute abnormality. Hepatobiliary: Cholelithiasis is noted. Multiple hepatic low densities are noted most consistent with cysts in the absence of any history of malignancy. Pancreas: Unremarkable. No pancreatic ductal dilatation or surrounding inflammatory changes. Spleen:  Normal in size without focal abnormality. Adrenals/Urinary Tract: Adrenal glands appear normal. Bilateral renal cysts are noted. No hydronephrosis or renal obstruction is noted. No renal or ureteral calculi are noted. Urinary bladder appears normal. Stomach/Bowel: Large amount of stool is noted in the rectum. Stomach appears normal. The appendix appears normal. There is no evidence of bowel obstruction. Wall thickening and surrounding inflammatory changes are seen involving several small  bowel loops in the central abdomen most consistent with focal enteritis. 33 x 22 x 16 mm fluid collection is seen between the small bowel loops concerning for small abscess. Vascular/Lymphatic: Aortic atherosclerosis. No enlarged abdominal or pelvic lymph nodes. Reproductive: Surgical clips are noted in prostatic bed. Patient appears to be status post prostatectomy. Other: No abdominal wall hernia or abnormality. No abdominopelvic ascites. Musculoskeletal: No acute or significant osseous findings. IMPRESSION: Cholelithiasis. Multiple hepatic cysts. Aortic atherosclerosis. Inflamed small bowel loops are noted anteriorly in the central abdomen most consistent with focal enteritis. 3.3 x 2.2 x 1.6 cm fluid collection is seen between the small bowel loops concerning for small abscess. Electronically Signed   By: Marijo Conception, M.D.   On: 03/24/2017 13:00    Anti-infectives: Anti-infectives (From admission, onward)   Start     Dose/Rate Route Frequency Ordered Stop   03/24/17 2359  piperacillin-tazobactam (ZOSYN) IVPB 3.375 g     3.375 g 12.5 mL/hr over 240 Minutes Intravenous Every 8 hours 03/24/17 1603     03/24/17 1515  piperacillin-tazobactam (ZOSYN) IVPB 3.375 g     3.375 g 100 mL/hr over 30 Minutes Intravenous STAT 03/24/17 1503 03/24/17 1856       Assessment/Plan Abdominal pain: Enteritis and possible abscess per CT. Admits to mild abdominal pain. - afebrile, VSS - WBC: WNL on IV abx - Continue IV fluids - IR reviewed CT and noted tiny air-filled collection between loops of bowel, they do not recommend drain/aspirate but to follow for now - Strict I/Os  - given symptom improvement and no procedures planned at this time can advance diet to clear liquids  Will continue to follow.  FEN: clear liquids     DVT: Lovenox ID: Zosyn 11/25 >> Foley: in place, urine yellow and clear   LOS: 1 day    Tonny Branch , PA-S2  03/26/2017, 9:06 AM

## 2017-03-26 NOTE — Progress Notes (Signed)
Family Medicine Teaching Service Daily Progress Note Intern Pager: [phone]  Patient name: Charles Chan Medical record number: [id number] Date of birth: [birth year] Age: 76 y.o. Gender: male  Primary Care Provider: Smiley Houseman, MD Consultants: Surgery, IR Code Status: DNI  Pt Overview and Major Events to Date:  Charles Chan is a 76 y.o. male presenting with 2 days of acute abdominal pain. PMH is significant for significant for hx of colon cancer, hx of prostate cancer, atrial flutter, HLD, hypertension, hx of stroke, CAD, dementia  Assessment and Plan:  Enteritis and small abscess.  WBC wnl. Surgery and IR recommend watching. Denies any abdominal pain. Will stop IV abx and transition to  -Will continue Zosyn (11/25 -) Will transition to PO abx after IR assessment - appreciate IR recommendations - appreciate surgery recommendations -CBC and BMP -NPO for now  -Zofran as needed for nausea  -strict I/Os  Cough: Lung exam improved today.Satting well on RA. Denies SOB. Chest x-ray significant for COPD/emphysema.  -Tessalon Perles as needed   Hypertension: Hypertenive to 160s/80s.  -Restart Norvasc and Lopressor 25 mg  AKI: Cr improved to 1.1 Baseline serum creatinine 1.2. Gentle fluid hydration while NPO.  -trend CR  Dementia: Patient alert and orientated x1.  Unable to tell us where he was or what year it was.  Per his wife this is his baseline.  Patient with no focal deficiency, moving all limbs equally -Will obtain PT/OT for patient  FEN/GI: soft diet, ADAT Prophylaxis: Lovenox  Disposition: if tolerating diet and po, discharge home  Subjective:  Denies any abdominal pain, chest pain, SOB.  No other issues.   Objective: Temp:  [97.6 F (36.4 C)-98.5 F (36.9 C)] 97.6 F (36.4 C) (11/27 0640) Pulse Rate:  [79-92] 79 (11/27 0640) Resp:  [18] 18 (11/27 0640) BP: (133-152)/(86-90) 152/88 (11/27 0640) SpO2:  [93 %-98 %] 94 % (11/27  0640) Physical Exam: General: NAD, resting in bed Cardiovascular: rrr, no mrg Respiratory: improved. rhonci throughout, NWOB Abdomen: nontender, soft, no rebound/guarding Extremities: no lower extremity edema, 2+dp  Laboratory: Recent Labs  Lab 03/25/17 0612 03/25/17 0836 03/26/17 0552  WBC 8.6 7.8 7.9  HGB 14.7 14.1 13.6  HCT 42.5 41.7 40.4  PLT 170 193 201   Recent Labs  Lab 03/24/17 1133 03/24/17 1953 03/25/17 0836 03/26/17 0552  NA 138  --  139 137  K 3.6  --  3.6 4.7  CL 110  --  110 107  CO2 22  --  22 24  BUN 12  --  9 6  CREATININE 1.31* 1.10 1.16 1.23  CALCIUM 9.0  --  8.6* 8.9  PROT 7.1  --   --   --   BILITOT 1.0  --   --   --   ALKPHOS 78  --   --   --   ALT 11*  --   --   --   AST 17  --   --   --   GLUCOSE 109*  --  89 97     Charles Hollow, MD 03/26/2017, 1:11 PM PGY-1, [place] Intern pager: [phone], text pages welcome

## 2017-03-26 NOTE — Progress Notes (Signed)
Occupational Therapy Treatment Patient Details Name: Charles Chan MRN: [id number] DOB: 1940-10-17 Today's Date: 03/26/2017    History of present illness 76 y.o. male presenting with 2 days of acute abdominal pain. PMH is significant for significant for hx of colon cancer, hx of prostate cancer, atrial flutter, HLD, hypertension, hx of stroke, CAD, dementia   OT comments  Educated wife in benefits of tub transfer bench, wife will purchase in the community.   Follow Up Recommendations  No OT follow up    Equipment Recommendations  None recommended by OT    Recommendations for Other Services      Precautions / Restrictions Precautions Precautions: Fall       Mobility Bed Mobility Overal bed mobility: Needs Assistance Bed Mobility: Supine to Sit;Sit to Supine     Supine to sit: Supervision Sit to supine: Supervision      Transfers Overall transfer level: Needs assistance Equipment used: Rolling walker (2 wheeled) Transfers: Sit to/from Stand Sit to Stand: Min guard         General transfer comment: cues for hand placement    Balance                                           ADL either performed or assessed with clinical judgement   ADL Overall ADL's : Needs assistance/impaired     Grooming: Wash/dry hands;Standing;Min guard                   Toilet Transfer: Min Marine scientist Details (indicate cue type and reason): stood to urinate Toileting- Water quality scientist and Hygiene: Min guard;Sit to/from stand;With caregiver independent assisting   Tub/ Shower Transfer: Min guard;Ambulation;Tub bench;Rolling walker Tub/Shower Transfer Details (indicate cue type and reason): pt and wife educated in benefits and use of tub transfer bench Functional mobility during ADLs: Min guard;Rolling walker General ADL Comments: wife is familiar with tub bench, her mother had one, wife to look for one in the community      Vision       Perception     Praxis      Cognition Arousal/Alertness: Awake/alert Behavior During Therapy: WFL for tasks assessed/performed Overall Cognitive Status: History of cognitive impairments - at baseline                                 General Comments: educated wife in importance of pt continuing to perform as much for himself as possible/safe        Exercises     Shoulder Instructions       General Comments      Pertinent Vitals/ Pain       Pain Assessment: No/denies pain  Home Living                                          Prior Functioning/Environment              Frequency  Min 1X/week        Progress Toward Goals  OT Goals(current goals can now be found in the care plan section)  Progress towards OT goals: Progressing toward goals  Acute Rehab OT Goals Patient Stated Goal: to return home with HHPT OT  Goal Formulation: With patient/family Time For Goal Achievement: 04/01/17 Potential to Achieve Goals: Good  Plan Discharge plan remains appropriate    Co-evaluation                 AM-PAC PT "6 Clicks" Daily Activity     Outcome Measure   Help from another person eating meals?: None Help from another person taking care of personal grooming?: A Little Help from another person toileting, which includes using toliet, bedpan, or urinal?: A Little Help from another person bathing (including washing, rinsing, drying)?: A Little Help from another person to put on and taking off regular upper body clothing?: None Help from another person to put on and taking off regular lower body clothing?: A Little 6 Click Score: 20    End of Session Equipment Utilized During Treatment: Gait belt;Rolling walker  OT Visit Diagnosis: Unsteadiness on feet (R26.81);History of falling (Z91.81);Muscle weakness (generalized) (M62.81);Other symptoms and signs involving cognitive function   Activity Tolerance Patient  tolerated treatment well   Patient Left in bed;with call bell/phone within reach;with family/visitor present   Nurse Communication          Time: 3662-9476 OT Time Calculation (min): 18 min  Charges: OT General Charges $OT Visit: 1 Visit OT Treatments $Self Care/Home Management : 8-22 mins    Malka So 03/26/2017, 8:58 AM 03/26/2017 Nestor Lewandowsky, OTR/L Pager: [phone]

## 2017-03-26 NOTE — Discharge Instructions (Signed)
You were admitted to the hospital with an intra-abdominal infection with small abscess. Please take your antibiotics as prescribed. Call your regular doctor if you develop worsening abdominal pain, diarrhea, vomiting, or a temperature of > 100.4. Please go to your scheduled follow up appointment with your PCP.

## 2017-03-26 NOTE — Progress Notes (Signed)
Physical Therapy Treatment Patient Details Name: Charles Chan MRN: [id number] DOB: [birth year] Today's Date: 03/26/2017    History of Present Illness 76 y.o. male presenting with 2 days of acute abdominal pain. PMH is significant for significant for hx of colon cancer, hx of prostate cancer, atrial flutter, HLD, hypertension, hx of stroke, CAD, dementia    PT Comments    Session focused on stair training in preparation for d/c home when medically cleared. Pt and family educated on proper sequencing and safe use of AD on stairs for home set up. (See General Stair Comments section below). PT will continue to follow to reinforce technique in subsequent visits during patients hospitalization.     Follow Up Recommendations  Home health PT;Supervision/Assistance - 24 hour     Equipment Recommendations  Rolling walker with 5" wheels    Recommendations for Other Services       Precautions / Restrictions Precautions Precautions: Fall Precaution Comments: generalized weakness Restrictions Weight Bearing Restrictions: No    Mobility  Bed Mobility               General bed mobility comments: OOB at entry  Transfers Overall transfer level: Needs assistance Equipment used: Rolling walker (2 wheeled) Transfers: Sit to/from Stand Sit to Stand: Min guard         General transfer comment: cues for hand placement  Ambulation/Gait                 Stairs Stairs: Yes   Stair Management: One rail Right;With cane Number of Stairs: 16 General stair comments: Extensive time training patient and family for proper sequencing and safety on stairs. patient has 2 platform stairs followed by 6 stairs to enter home with 1 rail, used SPC and UE support for stair training, RW for platforms. Pts wife able to cue patient properally, pt not demonstrating consistency with sequencing and reverts back to old habbits during session. safe dynamic balance with family supervising.  educated family on proper guarding positions for safe return home.   Wheelchair Mobility    Modified Rankin (Stroke Patients Only)       Balance Overall balance assessment: Needs assistance Sitting-balance support: Feet supported;No upper extremity supported Sitting balance-Leahy Scale: Fair     Standing balance support: During functional activity;Bilateral upper extremity supported Standing balance-Leahy Scale: Fair                              Cognition Arousal/Alertness: Awake/alert Behavior During Therapy: WFL for tasks assessed/performed Overall Cognitive Status: History of cognitive impairments - at baseline                                        Exercises      General Comments        Pertinent Vitals/Pain Pain Assessment: No/denies pain    Home Living                      Prior Function            PT Goals (current goals can now be found in the care plan section) Acute Rehab PT Goals Patient Stated Goal: to return home with HHPT PT Goal Formulation: With patient/family Time For Goal Achievement: 04/01/17 Potential to Achieve Goals: Good Progress towards PT goals: Progressing toward goals    Frequency  Min 3X/week      PT Plan Current plan remains appropriate    Co-evaluation              AM-PAC PT "6 Clicks" Daily Activity  Outcome Measure  Difficulty turning over in bed (including adjusting bedclothes, sheets and blankets)?: A Little Difficulty moving from lying on back to sitting on the side of the bed? : A Little Difficulty sitting down on and standing up from a chair with arms (e.g., wheelchair, bedside commode, etc,.)?: A Little Help needed moving to and from a bed to chair (including a wheelchair)?: A Little Help needed walking in hospital room?: A Little Help needed climbing 3-5 steps with a railing? : A Little 6 Click Score: 18    End of Session Equipment Utilized During Treatment:  Gait belt Activity Tolerance: Patient tolerated treatment well Patient left: in chair;with call bell/phone within reach;with family/visitor present Nurse Communication: Mobility status PT Visit Diagnosis: Unsteadiness on feet (R26.81);Other abnormalities of gait and mobility (R26.89);Muscle weakness (generalized) (M62.81);Repeated falls (R29.6);Difficulty in walking, not elsewhere classified (R26.2)     Time: 1100-1136 PT Time Calculation (min) (ACUTE ONLY): 36 min  Charges:  $Gait Training: 23-37 mins                    G Codes:       Reinaldo Berber, PT, DPT Acute Rehab Services Pager: [phone]     Reinaldo Berber 03/26/2017, 12:07 PM

## 2017-03-27 ENCOUNTER — Ambulatory Visit: Payer: Medicare Other | Admitting: Podiatry

## 2017-03-27 MED ORDER — AMLODIPINE BESYLATE 10 MG PO TABS: 10.0000 mg | ORAL_TABLET | Freq: Every day | ORAL | Status: DC

## 2017-03-27 MED ORDER — CIPROFLOXACIN HCL 500 MG PO TABS: 500.0000 mg | ORAL_TABLET | Freq: Two times a day (BID) | ORAL | 0 refills | Status: AC

## 2017-03-27 MED ORDER — METRONIDAZOLE 500 MG PO TABS
500.0000 mg | ORAL_TABLET | Freq: Three times a day (TID) | ORAL | 0 refills | Status: AC
Start: 2017-03-27 — End: 2017-03-30

## 2017-03-27 NOTE — Discharge Summary (Signed)
[facility] Discharge Summary  Patient name: Charles Chan Medical record number: [id number] Date of birth: Aug 11, 1940 Age: 76 y.o. Gender: male Date of Admission: 03/24/2017  Date of Discharge: 03/27/2017 Admitting Physician: Lind Covert, MD  Primary Care Provider: Smiley Houseman, MD Consultants: Surgery, IR  Indication for Hospitalization: Enteritis with Intraabdominal Abscess  Discharge Diagnoses/Problem List:    Disposition: Home with HHPT/OT  Discharge Condition: Improved  Discharge Exam:  General: NAD, well appearing CVS: RRR, no MRG Lungs: CTAB, no increased work of breathing Abdomen: Soft, nontender, nondistended MSK: No lower extremity edema, 2+ dp  Brief Hospital Course:  Patient was admitted after presenting to the ED with 2 days of worsening abdominal pain. He had mild leukocytosis of 13. Abdominal CT showed enteritis with small abscess. Remainer of labs were grossly normal. Vital signs were stable and he was afebrile. Patient was started on broad spectrum antibiotics of Vancomycin and Zosyn. He was evaluated by both Surgery and interventional radiology who felt that the abscess was too small too warrant intervention. Patients abdominal pain improved on antibiotics. He successfully was transition to PO ciprofloxacin and metronidazole. Patient was discharged home with to complete a full 7 day course of antibiotics.  Issues for Follow Up:  1. Please ensure patient finishes complete course of antibiotics  Significant Procedures: None  Significant Labs and Imaging:  Recent Labs  Lab 03/25/17 0612 03/25/17 0836 03/26/17 0552  WBC 8.6 7.8 7.9  HGB 14.7 14.1 13.6  HCT 42.5 41.7 40.4  PLT 170 193 201   Recent Labs  Lab 03/24/17 1133 03/24/17 1953 03/25/17 0836 03/26/17 0552  NA 138  --  139 137  K 3.6  --  3.6 4.7  CL 110  --  110 107  CO2 22  --  22 24  GLUCOSE 109*  --  89 97  BUN 12  --  9 6  CREATININE  1.31* 1.10 1.16 1.23  CALCIUM 9.0  --  8.6* 8.9  ALKPHOS 78  --   --   --   AST 17  --   --   --   ALT 11*  --   --   --   ALBUMIN 3.5  --   --   --     Dg Chest 2 View  Result Date: 03/24/2017 CLINICAL DATA:  76 year old male with right lower quadrant pain for 2 days. EXAM: CHEST  2 VIEW COMPARISON:  None. FINDINGS: Cardiomediastinal silhouette is normal in size and configuration. There are mild changes of COPD with prominence of the interstitium and bibasilar atelectasis. No focal parenchymal opacity, pleural effusion or pneumothorax. No acute osseous abnormality. IMPRESSION: Chronic emphysematous/ COPD changes with bibasilar atelectasis. No acute intrathoracic process. Electronically Signed   By: Kristopher Oppenheim M.D.   On: 03/24/2017 12:38   Ct Abdomen Pelvis W Contrast  Result Date: 03/24/2017 CLINICAL DATA:  Acute right lower quadrant abdominal pain. EXAM: CT ABDOMEN AND PELVIS WITH CONTRAST TECHNIQUE: Multidetector CT imaging of the abdomen and pelvis was performed using the standard protocol following bolus administration of intravenous contrast. CONTRAST:  100 mL ISOVUE-300 IOPAMIDOL (ISOVUE-300) INJECTION 61% COMPARISON:  None. FINDINGS: Lower chest: No acute abnormality. Hepatobiliary: Cholelithiasis is noted. Multiple hepatic low densities are noted most consistent with cysts in the absence of any history of malignancy. Pancreas: Unremarkable. No pancreatic ductal dilatation or surrounding inflammatory changes. Spleen: Normal in size without focal abnormality. Adrenals/Urinary Tract: Adrenal glands appear normal. Bilateral renal cysts are  noted. No hydronephrosis or renal obstruction is noted. No renal or ureteral calculi are noted. Urinary bladder appears normal. Stomach/Bowel: Large amount of stool is noted in the rectum. Stomach appears normal. The appendix appears normal. There is no evidence of bowel obstruction. Wall thickening and surrounding inflammatory changes are seen involving  several small bowel loops in the central abdomen most consistent with focal enteritis. 33 x 22 x 16 mm fluid collection is seen between the small bowel loops concerning for small abscess. Vascular/Lymphatic: Aortic atherosclerosis. No enlarged abdominal or pelvic lymph nodes. Reproductive: Surgical clips are noted in prostatic bed. Patient appears to be status post prostatectomy. Other: No abdominal wall hernia or abnormality. No abdominopelvic ascites. Musculoskeletal: No acute or significant osseous findings. IMPRESSION: Cholelithiasis. Multiple hepatic cysts. Aortic atherosclerosis. Inflamed small bowel loops are noted anteriorly in the central abdomen most consistent with focal enteritis. 3.3 x 2.2 x 1.6 cm fluid collection is seen between the small bowel loops concerning for small abscess. Electronically Signed   By: Marijo Conception, M.D.   On: 03/24/2017 13:00    Results/Tests Pending at Time of Discharge: None  Discharge Medications:  Allergies as of 03/27/2017      Reactions   Shellfish Allergy Hives      Medication List    TAKE these medications   amLODipine 10 MG tablet Commonly known as:  NORVASC Take 1 tablet (10 mg total) by mouth daily. What changed:  how much to take   aspirin 325 MG EC tablet Take 325 mg by mouth daily.   ciprofloxacin 500 MG tablet Commonly known as:  CIPRO Take 1 tablet (500 mg total) by mouth 2 (two) times daily for 3 days.   cyanocobalamin 1000 MCG/ML injection Commonly known as:  (VITAMIN B-12) INJECT 1 ML INTRAMUSCULARLY ONCE A MONTH   docusate sodium 100 MG capsule Commonly known as:  COLACE Take 100 mg by mouth daily.   metoprolol succinate 25 MG 24 hr tablet Commonly known as:  TOPROL-XL Take 25 mg by mouth daily.   metroNIDAZOLE 500 MG tablet Commonly known as:  FLAGYL Take 1 tablet (500 mg total) by mouth every 8 (eight) hours for 3 days.   nitroGLYCERIN 0.4 MG SL tablet Commonly known as:  NITROSTAT Place 0.4 mg under the tongue  every 5 (five) minutes as needed for chest pain.   simvastatin 20 MG tablet Commonly known as:  ZOCOR Take 20 mg by mouth daily at 6 PM.            Durable Medical Equipment  (From admission, onward)        Start     Ordered   03/26/17 1739  For home use only DME 4 wheeled rolling walker with seat  Once    Comments:  PT recommending walker with 5" wheels  Question:  Patient needs a walker to treat with the following condition  Answer:  Balance problem   03/26/17 1739      Discharge Instructions: Please refer to Patient Instructions section of EMR for full details.  Patient was counseled important signs and symptoms that should prompt return to medical care, changes in medications, dietary instructions, activity restrictions, and follow up appointments.   Follow-Up Appointments: Follow-up Information    Smiley Houseman, MD. Go on 04/03/2017.   Specialty:  Family Medicine Why:  Vertis Kelch at 8:50AM Contact information: [place] [address] [phone]           Bonnita Hollow, MD  03/27/2017, 3:42 PM PGY-1, [place]

## 2017-03-27 NOTE — Progress Notes (Signed)
Physical Therapy Treatment Patient Details Name: Charles Chan MRN: [id number] DOB: August 28, 1940 Today's Date: 03/27/2017    History of Present Illness 76 y.o. male presenting with 2 days of acute abdominal pain. PMH is significant for significant for hx of colon cancer, hx of prostate cancer, atrial flutter, HLD, hypertension, hx of stroke, CAD, dementia    PT Comments    Session focused on reinforcing stair training. (See General Stair Comments section below). Pt performing safe ambulation on stairs at this time with family support for supervision and guarding. Will continue to follow to progress activity during hospitalization.   Follow Up Recommendations  Home health PT;Supervision/Assistance - 24 hour     Equipment Recommendations  Rolling walker with 5" wheels    Recommendations for Other Services       Precautions / Restrictions Precautions Precautions: Fall Precaution Comments: generalized weakness Restrictions Weight Bearing Restrictions: No    Mobility  Bed Mobility Overal bed mobility: Needs Assistance Bed Mobility: Supine to Sit;Sit to Supine     Supine to sit: Supervision Sit to supine: Supervision   General bed mobility comments: OOB at entry  Transfers Overall transfer level: Needs assistance Equipment used: Rolling walker (2 wheeled) Transfers: Sit to/from Stand Sit to Stand: Supervision            Ambulation/Gait Ambulation/Gait assistance: Supervision Ambulation Distance (Feet): 5 Feet Assistive device: Rolling walker (2 wheeled) Gait Pattern/deviations: Step-to pattern;Step-through pattern Gait velocity: decreased       Stairs Stairs: Yes   Stair Management: One rail Right;With cane Number of Stairs: 16 General stair comments: extensive time training and cueing patient with stairs, due to dementia unable to follow cues throughout session with cary over, however family now firmly understand sequencing and are comfortable cueing  patient. Despite this, patient demonstrating safe balance. Family reports they will be with patient at all time, when he is ambulating stairs, and currently pt able to perform with supervison/cues. Educated on proper guarding techinque.   Wheelchair Mobility    Modified Rankin (Stroke Patients Only)       Balance Overall balance assessment: Needs assistance Sitting-balance support: Feet supported;No upper extremity supported Sitting balance-Leahy Scale: Good     Standing balance support: During functional activity;Bilateral upper extremity supported Standing balance-Leahy Scale: Fair                              Cognition Arousal/Alertness: Awake/alert Behavior During Therapy: WFL for tasks assessed/performed Overall Cognitive Status: History of cognitive impairments - at baseline                                 General Comments: dementia at baseline      Exercises      General Comments        Pertinent Vitals/Pain Pain Assessment: No/denies pain    Home Living                      Prior Function            PT Goals (current goals can now be found in the care plan section) Acute Rehab PT Goals Patient Stated Goal: to return home with HHPT PT Goal Formulation: With patient/family Time For Goal Achievement: 04/01/17 Potential to Achieve Goals: Good Progress towards PT goals: Progressing toward goals    Frequency    Min 3X/week  PT Plan Current plan remains appropriate    Co-evaluation              AM-PAC PT "6 Clicks" Daily Activity  Outcome Measure  Difficulty turning over in bed (including adjusting bedclothes, sheets and blankets)?: None Difficulty moving from lying on back to sitting on the side of the bed? : None Difficulty sitting down on and standing up from a chair with arms (e.g., wheelchair, bedside commode, etc,.)?: None Help needed moving to and from a bed to chair (including a wheelchair)?: A  Little Help needed walking in hospital room?: A Little Help needed climbing 3-5 steps with a railing? : A Little 6 Click Score: 21    End of Session Equipment Utilized During Treatment: Gait belt Activity Tolerance: Patient tolerated treatment well Patient left: in chair;with call bell/phone within reach;with family/visitor present Nurse Communication: Mobility status PT Visit Diagnosis: Unsteadiness on feet (R26.81);Other abnormalities of gait and mobility (R26.89);Muscle weakness (generalized) (M62.81);Repeated falls (R29.6);Difficulty in walking, not elsewhere classified (R26.2)     Time: 6004-5997 PT Time Calculation (min) (ACUTE ONLY): 28 min  Charges:  $Gait Training: 8-22 mins                    G Codes:       Reinaldo Berber, PT, DPT Acute Rehab Services Pager: [phone]     Reinaldo Berber 03/27/2017, 1:41 PM

## 2017-03-27 NOTE — Care Management Note (Signed)
Case Management Note  Patient Details  Name: Charles Chan MRN: [id number] Date of Birth: Mar 30, 1941  Subjective/Objective:                    Action/Plan:  Spoke to patient and wife at bedside. Just moved here from [facility].  [address], [place], [place] [address]  Ordered rolling walker with seat through [facility].  Expected Discharge Date:  03/27/17               Expected Discharge Plan:  [place]  In-House Referral:     Discharge planning Services  CM Consult  Post Acute Care Choice:  Home Health, Durable Medical Equipment Choice offered to:  Patient, Spouse  DME Arranged:  Walker rolling with seat DME Agency:  [place]:  OT, PT [place] Agency:  [place]  Status of Service:     If discussed at [place] of Stay Meetings, dates discussed:    Additional Comments:  Marilu Favre, RN 03/27/2017, 9:53 AM

## 2017-03-27 NOTE — Progress Notes (Signed)
[facility] Progress Note     Subjective: CC: abdominal pain  Charles Chan admits to only some mild abdominal soreness currently. He is having BMs and passing flatus. He tolerated soft foods yesterday and states his appetite is good. Denies N/V/D, fever, chills.  Objective: Vital signs in last 24 hours: Temp:  [97.6 F (36.4 C)-98.5 F (36.9 C)] 98.5 F (36.9 C) (11/28 0417) Pulse Rate:  [72-96] 81 (11/28 0417) Resp:  [17-20] 20 (11/28 0417) BP: (143-190)/(71-98) 143/76 (11/28 0417) SpO2:  [94 %-98 %] 94 % (11/28 0417) Last BM Date: 03/26/17  Intake/Output from previous day: 11/27 0701 - 11/28 0700 In: 840 [P.O.:840] Out: 2050 [Urine:2050] Intake/Output this shift: No intake/output data recorded.  PE: Gen:  Alert, NAD, pleasant Pulm:  Normal effort Abd: Soft, mildly sore to palpation RLQ, non-distended, bowel sounds present in all 4 quadrants Skin: warm and dry, no rashes  Psych: A&Ox2   Lab Results:  Recent Labs    03/25/17 0836 03/26/17 0552  WBC 7.8 7.9  HGB 14.1 13.6  HCT 41.7 40.4  PLT 193 201   BMET Recent Labs    03/25/17 0836 03/26/17 0552  NA 139 137  K 3.6 4.7  CL 110 107  CO2 22 24  GLUCOSE 89 97  BUN 9 6  CREATININE 1.16 1.23  CALCIUM 8.6* 8.9   PT/INR Recent Labs    03/25/17 0836  LABPROT 14.0  INR 1.09   CMP     Component Value Date/Time   NA 137 03/26/2017 0552   NA 143 11/22/2016 1233   K 4.7 03/26/2017 0552   CL 107 03/26/2017 0552   CO2 24 03/26/2017 0552   GLUCOSE 97 03/26/2017 0552   BUN 6 03/26/2017 0552   BUN 11 11/22/2016 1233   CREATININE 1.23 03/26/2017 0552   CALCIUM 8.9 03/26/2017 0552   PROT 7.1 03/24/2017 1133   ALBUMIN 3.5 03/24/2017 1133   AST 17 03/24/2017 1133   ALT 11 (L) 03/24/2017 1133   ALKPHOS 78 03/24/2017 1133   BILITOT 1.0 03/24/2017 1133   GFRNONAA 56 (L) 03/26/2017 0552   GFRAA >60 03/26/2017 0552   Lipase     Component Value Date/Time   LIPASE 21 03/24/2017 1133        Studies/Results: No results found.  Anti-infectives: Anti-infectives (From admission, onward)   Start     Dose/Rate Route Frequency Ordered Stop   03/26/17 2000  ciprofloxacin (CIPRO) tablet 500 mg     500 mg Oral 2 times daily 03/26/17 1539     03/26/17 1545  metroNIDAZOLE (FLAGYL) tablet 500 mg     500 mg Oral Every 8 hours 03/26/17 1539     03/24/17 2359  piperacillin-tazobactam (ZOSYN) IVPB 3.375 g  Status:  Discontinued     3.375 g 12.5 mL/hr over 240 Minutes Intravenous Every 8 hours 03/24/17 1603 03/26/17 1539   03/24/17 1515  piperacillin-tazobactam (ZOSYN) IVPB 3.375 g     3.375 g 100 mL/hr over 30 Minutes Intravenous STAT 03/24/17 1503 03/24/17 1856       Assessment/Plan Abdominal pain: Enteritis and possible abscess per CT. Admits to mild abdominal pain to palpation  - afebrile, VSS - WBC: WNL yesterday - No aspiration/drain per IR  - Diet: advanced yesterday. Tolerating soft foods. Continue to ADAT  Enteritis improving on non-operative management, no need for surgical management. Will sign off. Please consult again for any new changes or problems.  FEN: full diet DVT: Lovenox ID: Zosyn 11/25 >  11/27, metronidazole 11/27 >>, Ciprofloxacin 11/27 >> Foley: in place, urine yellow and clear     LOS: 2 days    Charles Chan , PA-S2  03/27/2017, 7:54 AM

## 2017-03-28 ENCOUNTER — Telehealth: Payer: Self-pay | Admitting: Internal Medicine

## 2017-03-28 NOTE — Telephone Encounter (Signed)
Social Call for recent hospitalization   Called patient's number, and his wife picked up the phone. Reports that husband is sleeping currently. (patient was discharged yesterday evening). Reports he is doing better since the discharge. Reports that he does have a follow up at our clinic but received a letter stating that she will need to change doctors due to insurance coverage. She will call the insurance company to get more details and will let us know.

## 2017-04-03 ENCOUNTER — Other Ambulatory Visit: Payer: Self-pay

## 2017-04-03 ENCOUNTER — Encounter: Payer: Self-pay | Admitting: Internal Medicine

## 2017-04-03 ENCOUNTER — Ambulatory Visit: Payer: Medicare Other | Admitting: Internal Medicine

## 2017-04-03 VITALS — BP 138/68 | HR 68 | Temp 97.7°F | Ht 71.0 in | Wt 186.0 lb

## 2017-04-03 DIAGNOSIS — K529 Noninfective gastroenteritis and colitis, unspecified: Secondary | ICD-10-CM

## 2017-04-03 DIAGNOSIS — Z09 Encounter for follow-up examination after completed treatment for conditions other than malignant neoplasm: Secondary | ICD-10-CM | POA: Diagnosis not present

## 2017-04-03 NOTE — Progress Notes (Signed)
   [facility] Phone: [phone]   Date of Visit: 04/03/2017   HPI:  Hospital follow-up for enteritis: -Patient was admitted from 11/25-11/28 for enteritis with intra-abdominal abscess.  CT abdomen showed a small abscess.  He had mild leukocytosis of 13 but all other labs were grossly.  He was started on vancomycin and Zosyn and transition to ciprofloxacin and metronidazole.  Surgery and IR  consulted and felt that the abscess was too small to warrant intervention.  Patient discharged home with plan to complete 7-day course of antibiotics. -Patient reports he is doing well since his discharge.  He no longer has any abdominal pain.  No nausea, vomiting, diarrhea.  He completed his antibiotics on Sunday.  ROS: See HPI.  [place]:  Atrial Flutter HTN CAD with Hx of MI  CHF Dementia HLD Hx of Prostate cancer Hx of alcohol abuse    PHYSICAL EXAM: BP 138/68   Pulse 68   Temp 97.7 F (36.5 C) (Oral)   Ht 5\' 11"  (1.803 m)   Wt 186 lb (84.4 kg)   SpO2 98%   BMI 25.94 kg/m  GEN: NAD, well-appearing CV: Irregular heart rhythm but normal rate, no murmurs, rubs, or gallops PULM: CTAB, normal effort ABD: Soft, nontender, nondistended, NABS, no organomegaly SKIN: No rash or cyanosis; warm and well-perfused EXTR: No lower extremity edema or calf tenderness PSYCH: Mood and affect euthymic, normal rate and volume of speech NEURO: Awake, alert, no focal deficits grossly, normal speech  ASSESSMENT/PLAN:  Health maintenance:  -Patient and wife report that they are up-to-date on vaccines and health maintenance points.  This was done at their prior PCPs office.  They report that they went to [facility].  They also went to Dr. Horton Finer in [place].  We will get release of information form signed so we can get records.  Hospital follow-up: Enteritis Patient is doing well and is asymptomatic.  He completed his antibiotic course.  FOLLOW UP: Follow up in 6  months for general checkup or sooner if there are concerns  Smiley Houseman, MD PGY [place]

## 2017-04-03 NOTE — Patient Instructions (Signed)
I am glad you are doing well.  Follow up in 6 months or sooner if you have concerns.

## 2017-04-09 ENCOUNTER — Encounter: Payer: Self-pay | Admitting: Internal Medicine

## 2017-04-09 DIAGNOSIS — K648 Other hemorrhoids: Secondary | ICD-10-CM | POA: Insufficient documentation

## 2017-04-09 DIAGNOSIS — K5730 Diverticulosis of large intestine without perforation or abscess without bleeding: Secondary | ICD-10-CM | POA: Insufficient documentation

## 2017-04-09 DIAGNOSIS — Z9049 Acquired absence of other specified parts of digestive tract: Secondary | ICD-10-CM

## 2017-04-09 NOTE — Progress Notes (Signed)
Received records from [place]: colonoscopy which showed extensive diverticulosis from sigmoid to cecum, internal hemorrhoids, radiation proctitis with active bleeding s/p treatment with gold probe. Follow up colonoscopy in 10 years. Placed in scan box.   Also had immunization record. He did get Prevnar 13 but not 23V at least looking at the [facility] immunization registry. Tetanus in 2013, zoster in 2013.

## 2017-04-16 ENCOUNTER — Ambulatory Visit: Payer: Medicare Other | Admitting: *Deleted

## 2017-04-17 ENCOUNTER — Telehealth: Payer: Self-pay | Admitting: Internal Medicine

## 2017-04-17 NOTE — Telephone Encounter (Signed)
Please have patient come in to be seen for HTN tomorrow for same day (if possible). If he is symptomatic (chest pain, shortness of breath, etc) he needs to go to the ED.

## 2017-04-17 NOTE — Telephone Encounter (Signed)
Beth with [facility] called and said when she went to visit this pt today that his bp was all over the place. Initial bp: 190/100 After Resting about 5 minutes: 162-94 (other arm) After Walking about 5 minutes: 150/88 Last one taken after resisted standing exercises: 172/90. She wanted to let the dr know to see what needs to be done about his bp. Please advise Eustaquio Maize can be reached at: [phone]

## 2017-04-17 NOTE — Telephone Encounter (Signed)
Pt has been contacted and scheduled for BP on Friday. ER precautions given.

## 2017-04-19 ENCOUNTER — Other Ambulatory Visit: Payer: Self-pay

## 2017-04-19 ENCOUNTER — Ambulatory Visit: Payer: Medicare Other | Admitting: Family Medicine

## 2017-04-19 ENCOUNTER — Encounter: Payer: Self-pay | Admitting: Family Medicine

## 2017-04-19 VITALS — BP 134/62 | HR 86 | Temp 97.7°F | Ht 71.0 in | Wt 180.0 lb

## 2017-04-19 DIAGNOSIS — I10 Essential (primary) hypertension: Secondary | ICD-10-CM | POA: Diagnosis not present

## 2017-04-19 NOTE — Patient Instructions (Signed)
It was great seeing you today! We have addressed the following issues today  1. Continue to record BP daily. Make a follow up appointment for blood pressure check in about two weeks. Make sure you bring your readings. 2. Be careful with salt, cut down on chips and try to stay away from the salt shaker.  If we did any lab work today, and the results require attention, either me or my nurse will get in touch with you. If everything is normal, you will get a letter in mail and a message via . If you don't hear from [place] in two weeks, please give [place] a call. Otherwise, we look forward to seeing you again at your next visit. If you have any questions or concerns before then, please call the clinic at [phone].  Please bring all your medications to every doctors visit  Sign up for My Chart to have easy access to your labs results, and communication with your Primary care physician. Please ask Front Desk for some assistance.   Please check-out at the front desk before leaving the clinic.    Take Care,   Dr. Andy Gauss

## 2017-04-19 NOTE — Progress Notes (Signed)
Subjective:    Patient ID: Charles Chan, male    DOB: Dec 19, 1940, 76 y.o.   MRN: [id number]   CC: Follow up for blood pressure  HPI: Patient is 76 yo male who presents today to follow up on recent elevated BP. Patient recently had SBP in the 190 and DBP in the 100. Patient continue to have elevated BP per [place] and was asked to follow up in clinic for BP recheck. Wife has been recording BP for the past two days which has been fluctuating between SBP 130-150 and DBP between 80-90.  Patient endorses consuming a lot of salty food.  Currently patient is symptomatic and denies any headache, dizziness, tinnitus, vision changes.  Patient is accompanied by his wife who has been attentive.  Patient has a nurse aide that comes to his house 2 times a week who will also continue to check his blood pressure.  Smoking status reviewed   ROS: all other systems were reviewed and are negative other than in the HPI   Past Medical History:  Diagnosis Date  . Ambulates with cane 11/22/2016   In the 90s, patient injured his knee (ligament tear?) and declined surgery. Since then he has been using a cane to ambulate. Reports he is not in pain.   Marland Kitchen CAD (coronary artery disease) 11/22/2016   With Stent in 1998  . CHF (congestive heart failure) ([place]) 11/22/2016   Diagnosed in 1998  . Dementia 11/23/2016  . Former smoker 11/22/2016   Quit 1980s  . H/O partial resection of colon 11/22/2016   Due to polyp in 2004   . History of alcohol abuse 11/22/2016   1970s  . History of MI (myocardial infarction) 11/22/2016   2001  . History of prostate cancer 11/22/2016   S/p radiation in 2015? Was followed by previous oncologist yearly (Dr. Nona Dell in [place])   . History of stroke 11/22/2016  . HLD (hyperlipidemia) 11/22/2016  . HTN (hypertension) 11/22/2016  . Irregular cardiac rhythm 11/22/2016  . OSA (obstructive sleep apnea) 11/22/2016   History of OSA. Has not required CPAP since May 2018    Past Surgical  History:  Procedure Laterality Date  . COLON RESECTION  2004  . CORONARY ANGIOPLASTY WITH STENT PLACEMENT      Past medical history, surgical, family, and social history reviewed and updated in the EMR as appropriate.  Objective:  BP 134/62   Pulse 86   Temp 97.7 F (36.5 C) (Oral)   Ht 5\' 11"  (1.803 m)   Wt 180 lb (81.6 kg)   SpO2 95%   BMI 25.10 kg/m   Vitals and nursing note reviewed  General: NAD, pleasant, able to participate in exam Cardiac: RRR, normal heart sounds, no murmurs. 2+ radial and PT pulses bilaterally Respiratory: CTAB, normal effort, No wheezes, rales or rhonchi Abdomen: soft, nontender, nondistended, no hepatic or splenomegaly, +BS Extremities: no edema or cyanosis. WWP. Skin: warm and dry, no rashes noted Neuro: alert and oriented x4, no focal deficits Psych: Normal affect and mood   Assessment & Plan:   #Hypertension, uncontrolled, improving Today blood pressure is 134/62.  Patient's blood pressure is much improved from earlier this week when it was measured at 190/100.  In the past 3 days patient has been hovering around 878 systolic and 90 diastolic.  Given extensive cardiac history as well as history of stroke patient should be kept ideally below 676 systolic and 90 diastolic.  Patient is adherent to his medication.  Counseled  patient and her wife on reduced salt diet.  We will continue to monitor blood pressure at home and plan on office visit follow-up in 2 weeks to reassess need for continuation of current regimen or escalation. --Continue amlodipine 10 mg daily --Reduced salt diet --Follow-up in 2 weeks with PCP   Marjie Skiff, MD [facility] PGY-2

## 2017-05-03 ENCOUNTER — Encounter: Payer: Self-pay | Admitting: Family Medicine

## 2017-05-03 ENCOUNTER — Other Ambulatory Visit: Payer: Self-pay

## 2017-05-03 ENCOUNTER — Ambulatory Visit: Payer: Medicare Other | Admitting: Family Medicine

## 2017-05-03 VITALS — BP 132/64 | HR 80 | Temp 97.8°F | Ht 71.0 in | Wt 183.0 lb

## 2017-05-03 DIAGNOSIS — I10 Essential (primary) hypertension: Secondary | ICD-10-CM | POA: Diagnosis not present

## 2017-05-03 NOTE — Patient Instructions (Addendum)
It was great seeing you today! We have addressed the following issues today  1. Your BP is much better control continue with your current regimen. Make sure you make an appointment twith your regular doctor in the upcoming weeks for a check up. Continue the good work.  If we did any lab work today, and the results require attention, either me or my nurse will get in touch with you. If everything is normal, you will get a letter in mail and a message via . If you don't hear from [place] in two weeks, please give [place] a call. Otherwise, we look forward to seeing you again at your next visit. If you have any questions or concerns before then, please call the clinic at [phone].  Please bring all your medications to every doctors visit  Sign up for My Chart to have easy access to your labs results, and communication with your Primary care physician. Please ask Front Desk for some assistance.   Please check-out at the front desk before leaving the clinic.    Take Care,   Dr. Andy Gauss

## 2017-05-03 NOTE — Progress Notes (Signed)
Subjective:    Patient ID: Charles Chan, male    DOB: 16-Mar-1941, 77 y.o.   MRN: [id number]   CC: Follow-up for hypertension  HPI: Patient is a 77 year old male who presented to follow-up on recent elevated blood pressure.  Patient was seen in clinic on 12/21 after he was reported by home nurse to have elevated blood pressure.  At that visit blood pressure was already back close to normal limits.  Patient was asked to continue regular hypertensive regimen and follow-up in 2 weeks to ensure compliance and improvement in BP.  Patient has gets an accurate log of blood pressure taken 3 times a day since last office visit.  Systolic blood pressure have been around 130 with 1 or 2 readings above 150.  Passively blood pressure has been on 80 with 1 or 2 readings around 90.  Patient denies any headaches, shortness of breath, chest pain, tinnitus, dizziness.  Both him and his wife states that patient has been doing well acute complaints.  Smoking status reviewed   ROS: all other systems were reviewed and are negative other than in the HPI   Past Medical History:  Diagnosis Date  . Ambulates with cane 11/22/2016   In the 90s, patient injured his knee (ligament tear?) and declined surgery. Since then he has been using a cane to ambulate. Reports he is not in pain.   Marland Kitchen CAD (coronary artery disease) 11/22/2016   With Stent in 1998  . CHF (congestive heart failure) ([place]) 11/22/2016   Diagnosed in 1998  . Dementia 11/23/2016  . Former smoker 11/22/2016   Quit 1980s  . H/O partial resection of colon 11/22/2016   Due to polyp in 2004   . History of alcohol abuse 11/22/2016   1970s  . History of MI (myocardial infarction) 11/22/2016   2001  . History of prostate cancer 11/22/2016   S/p radiation in 2015? Was followed by previous oncologist yearly (Dr. Nona Dell in [place])   . History of stroke 11/22/2016  . HLD (hyperlipidemia) 11/22/2016  . HTN (hypertension) 11/22/2016  . Irregular cardiac rhythm  11/22/2016  . OSA (obstructive sleep apnea) 11/22/2016   History of OSA. Has not required CPAP since May 2018    Past Surgical History:  Procedure Laterality Date  . COLON RESECTION  2004  . CORONARY ANGIOPLASTY WITH STENT PLACEMENT      Past medical history, surgical, family, and social history reviewed and updated in the EMR as appropriate.  Objective:  BP 132/64   Pulse 80   Temp 97.8 F (36.6 C) (Oral)   Ht 5\' 11"  (1.803 m)   Wt 183 lb (83 kg)   SpO2 93%   BMI 25.52 kg/m   Vitals and nursing note reviewed  General: NAD, pleasant, able to participate in exam Cardiac: RRR, normal heart sounds, no murmurs. 2+ radial and PT pulses bilaterally Respiratory: CTAB, normal effort, No wheezes, rales or rhonchi Abdomen: soft, nontender, nondistended, no hepatic or splenomegaly, +BS Extremities: no edema or cyanosis. WWP. Skin: warm and dry, no rashes noted Neuro: alert and oriented x4, no focal deficits Psych: Normal affect and mood   Assessment & Plan:   #Hypertension follow-up, stable Today blood pressure is 132/64.  No acute concerns.  Blood pressure seems to be well-controlled now for the past 3 weeks.  She will continue current regimen of amlodipine 10 mg and metoprolol 25 mg daily.  Patient will follow up with PCP.   Marjie Skiff, MD [facility] PGY-2

## 2017-05-06 ENCOUNTER — Telehealth: Payer: Self-pay | Admitting: *Deleted

## 2017-05-06 NOTE — Telephone Encounter (Signed)
Beth [facility] PT) wanted to let the provider know that when she first went to pts house today his BP was 170/100, she advised him to rest and rechecked about 5 minutes later, the reading at that time was 160/92.  She decided to do a 3-4 minute walking assessment, his BP was then 180/102 so she did not perform anymore assessment.   Will forward to provider. Candies Palm, Salome Spotted, CMA

## 2017-05-07 NOTE — Telephone Encounter (Signed)
Please see if patient can come in to see a provider

## 2017-05-07 NOTE — Telephone Encounter (Signed)
Contacted wife:  She decline earlier appt, stating the following reason:  1.  BP was fine at last appt with [place] 2. Has a provider named Benjamine Mola that comes to the house and checks BP with readings between 130 -140 / 80 -90 3. Wife also checks BP with the same readings as above.    Wife feels like there was something wrong with Beths BP machine.   Will forward back to MD. Kaizlee Carlino, Salome Spotted, [place]

## 2017-05-14 ENCOUNTER — Telehealth: Payer: Self-pay | Admitting: Internal Medicine

## 2017-05-14 ENCOUNTER — Encounter: Payer: Self-pay | Admitting: Internal Medicine

## 2017-05-14 NOTE — Progress Notes (Signed)
Received fax note from PT to report that patient's BP was elevated at clinic. It looks like patient has an appointment with me next week.

## 2017-05-14 NOTE — Telephone Encounter (Signed)
Called [facility] and gave verbal orders for pt.

## 2017-05-14 NOTE — Telephone Encounter (Signed)
Please provide verbal order.

## 2017-05-14 NOTE — Telephone Encounter (Signed)
Charles Chan, with [facility] needs verbal orders for a skilled nurse consultation due to high blood pressure. She can be reached at [phone].

## 2017-05-14 NOTE — Telephone Encounter (Signed)
Will forward to MD. Rumaysa Sabatino,CMA  

## 2017-05-20 ENCOUNTER — Telehealth: Payer: Self-pay | Admitting: Internal Medicine

## 2017-05-20 NOTE — Telephone Encounter (Signed)
Patient needs a referral to Dr. Nelda Marseille, (cancer and kidney).

## 2017-05-20 NOTE — Telephone Encounter (Signed)
Will forward to MD. Jazmin Hartsell,CMA  

## 2017-05-22 ENCOUNTER — Other Ambulatory Visit: Payer: Self-pay | Admitting: Internal Medicine

## 2017-05-22 ENCOUNTER — Ambulatory Visit: Payer: Medicare Other | Admitting: Internal Medicine

## 2017-05-22 DIAGNOSIS — Z8546 Personal history of malignant neoplasm of prostate: Secondary | ICD-10-CM

## 2017-05-22 NOTE — Telephone Encounter (Signed)
Spoke with wife regarding her request for referral to kidney doctor. She actually meant urologist who was seeing patient yearly due to his history of prostate cancer. We discussed that we can do a PSA check here first and then refer to urology if needed. Wife understands and will make lab visit.

## 2017-05-23 ENCOUNTER — Telehealth: Payer: Self-pay | Admitting: *Deleted

## 2017-05-23 NOTE — Telephone Encounter (Signed)
Please provide verbal orders

## 2017-05-23 NOTE — Telephone Encounter (Signed)
Pt cancelled PT for tomorrow (Friday)  Mingo Amber needs verbal orders to go out on Monday.    [place], Salome Spotted, CMA

## 2017-05-24 ENCOUNTER — Other Ambulatory Visit: Payer: Medicare Other

## 2017-05-24 DIAGNOSIS — Z8546 Personal history of malignant neoplasm of prostate: Secondary | ICD-10-CM

## 2017-05-24 NOTE — Telephone Encounter (Signed)
I called and spoke with Charles Chan but she informed me that they will not these verbal orders now due to them having someone go out and see patient tomorrow (05/25/17).  She will call us back if anything else is needed. Charles Chan,CMA

## 2017-05-25 LAB — PSA: Prostate Specific Ag, Serum: 0.4 ng/mL (ref 0.0–4.0)

## 2017-05-28 ENCOUNTER — Telehealth: Payer: Self-pay | Admitting: Internal Medicine

## 2017-05-28 DIAGNOSIS — Z8546 Personal history of malignant neoplasm of prostate: Secondary | ICD-10-CM

## 2017-05-28 NOTE — Telephone Encounter (Signed)
Called home, wife picked up phone. Discussed normal PSA level. Also reported that I spoke with Dr. Lovena Neighbours, who will be happy to see patient to establish care in 6 months.

## 2017-05-30 ENCOUNTER — Encounter: Payer: Self-pay | Admitting: Internal Medicine

## 2017-05-31 ENCOUNTER — Other Ambulatory Visit: Payer: Self-pay | Admitting: Internal Medicine

## 2017-05-31 DIAGNOSIS — I10 Essential (primary) hypertension: Secondary | ICD-10-CM

## 2017-07-03 ENCOUNTER — Encounter: Payer: Self-pay | Admitting: Internal Medicine

## 2017-07-03 DIAGNOSIS — N400 Enlarged prostate without lower urinary tract symptoms: Secondary | ICD-10-CM | POA: Insufficient documentation

## 2017-08-11 NOTE — Progress Notes (Signed)
   [facility] Phone: [phone]   Date of Visit: 08/12/2017   HPI:  HTN:  - takes medication as prescribed: Norvasc 10mg  daily, Metoprolol XL 25mg  daily - denies HA, chest pain, blurred vision, lower extremity edema. No dizziness or lightheadedness.  - watches salt intake  ROS: See HPI.  [place]:  PMH: HTN CHF CAD Atrial Flutter OSA Diverticulosis of Colon Dementia BPH History of Stroke  History of prostate cancer H/o partial resection of colon Former Smoker     PHYSICAL EXAM: BP 110/70   Pulse 77   Temp (!) 97.5 F (36.4 C) (Oral)   Wt 180 lb (81.6 kg)   SpO2 95%   BMI 25.10 kg/m  GEN: NAD HEENT: Atraumatic, normocephalic, neck supple, EOMI, sclera clear  CV: RRR, no murmurs, rubs, or gallops PULM: CTAB, normal effort SKIN: No rash or cyanosis; warm and well-perfused EXTR: No lower extremity edema or calf tenderness PSYCH: Mood and affect euthymic, normal rate and volume of speech NEURO: Awake, alert, no focal deficits grossly, normal speech  ASSESSMENT/PLAN:  Health Maintenance:  - PNA 23 V given today   HTN (hypertension) BP stable continue current medications.   Smiley Houseman, MD PGY [place]

## 2017-08-12 ENCOUNTER — Encounter: Payer: Self-pay | Admitting: Internal Medicine

## 2017-08-12 ENCOUNTER — Ambulatory Visit: Payer: Medicare Other | Admitting: Internal Medicine

## 2017-08-12 ENCOUNTER — Other Ambulatory Visit: Payer: Self-pay

## 2017-08-12 DIAGNOSIS — Z23 Encounter for immunization: Secondary | ICD-10-CM

## 2017-08-12 DIAGNOSIS — I10 Essential (primary) hypertension: Secondary | ICD-10-CM

## 2017-08-12 DIAGNOSIS — I2510 Atherosclerotic heart disease of native coronary artery without angina pectoris: Secondary | ICD-10-CM | POA: Diagnosis not present

## 2017-08-12 MED ORDER — DOCUSATE SODIUM 100 MG PO CAPS: 100.0000 mg | ORAL_CAPSULE | Freq: Every day | ORAL | 0 refills | Status: DC

## 2017-08-12 MED ORDER — SIMVASTATIN 20 MG PO TABS: 20.0000 mg | ORAL_TABLET | Freq: Every day | ORAL | 3 refills | Status: DC

## 2017-08-12 MED ORDER — METOPROLOL SUCCINATE ER 25 MG PO TB24: 25.0000 mg | ORAL_TABLET | Freq: Every day | ORAL | 3 refills | Status: DC

## 2017-08-12 MED ORDER — AMLODIPINE BESYLATE 10 MG PO TABS: 10.0000 mg | ORAL_TABLET | Freq: Every day | ORAL | 2 refills | Status: DC

## 2017-08-12 NOTE — Assessment & Plan Note (Signed)
BP stable continue current medications.

## 2017-08-12 NOTE — Patient Instructions (Signed)
Thank you for coming in   You received your second Pneumonia vaccine today   You can follow up in about 6 months or sooner if you have concerns.

## 2017-08-28 ENCOUNTER — Ambulatory Visit: Payer: Medicare Other | Admitting: Podiatry

## 2017-08-28 ENCOUNTER — Encounter: Payer: Self-pay | Admitting: Podiatry

## 2017-08-28 DIAGNOSIS — M79676 Pain in unspecified toe(s): Secondary | ICD-10-CM

## 2017-08-28 DIAGNOSIS — B351 Tinea unguium: Secondary | ICD-10-CM | POA: Diagnosis not present

## 2017-08-30 ENCOUNTER — Other Ambulatory Visit: Payer: Self-pay

## 2017-08-30 DIAGNOSIS — I2510 Atherosclerotic heart disease of native coronary artery without angina pectoris: Secondary | ICD-10-CM

## 2017-08-30 DIAGNOSIS — I10 Essential (primary) hypertension: Secondary | ICD-10-CM

## 2017-08-30 MED ORDER — NITROGLYCERIN 0.4 MG SL SUBL: 0.4000 mg | SUBLINGUAL_TABLET | SUBLINGUAL | 0 refills | Status: DC | PRN

## 2017-08-30 MED ORDER — AMLODIPINE BESYLATE 10 MG PO TABS: 10.0000 mg | ORAL_TABLET | Freq: Every day | ORAL | 2 refills | Status: DC

## 2017-08-30 MED ORDER — METOPROLOL SUCCINATE ER 25 MG PO TB24: 25.0000 mg | ORAL_TABLET | Freq: Every day | ORAL | 3 refills | Status: AC

## 2017-08-30 MED ORDER — SIMVASTATIN 20 MG PO TABS: 20.0000 mg | ORAL_TABLET | Freq: Every day | ORAL | 3 refills | Status: DC

## 2017-08-30 MED ORDER — DOCUSATE SODIUM 100 MG PO CAPS: 100.0000 mg | ORAL_CAPSULE | Freq: Every day | ORAL | 0 refills | Status: DC

## 2017-09-02 ENCOUNTER — Other Ambulatory Visit: Payer: Self-pay | Admitting: Internal Medicine

## 2017-09-02 DIAGNOSIS — I10 Essential (primary) hypertension: Secondary | ICD-10-CM

## 2017-09-02 DIAGNOSIS — I2510 Atherosclerotic heart disease of native coronary artery without angina pectoris: Secondary | ICD-10-CM

## 2017-09-02 MED ORDER — AMLODIPINE BESYLATE 10 MG PO TABS: 10.0000 mg | ORAL_TABLET | Freq: Every day | ORAL | 2 refills | Status: DC

## 2017-09-02 MED ORDER — NITROGLYCERIN 0.4 MG SL SUBL: 0.4000 mg | SUBLINGUAL_TABLET | SUBLINGUAL | 0 refills | Status: AC | PRN

## 2017-09-02 MED ORDER — SIMVASTATIN 20 MG PO TABS: 20.0000 mg | ORAL_TABLET | Freq: Every day | ORAL | 3 refills | Status: DC

## 2017-09-02 NOTE — Telephone Encounter (Signed)
It appears that these medications were sent on 08/30/17. I will resend them.  I attempted to call patient to discuss, but his sister in law answered the phone. Asked them to call back.   Blue team, please call pharmacy to figure out what happened with the initial prescription. I do not want the patient to not have

## 2017-09-02 NOTE — Telephone Encounter (Signed)
Please take care of patients prescriptions Nitroglycerin tabs, Amlodipine, Simstat ?  to [facility]. Patient is out and needing medicine.  He also wants to make sure pharmacy is changed to [facility] now.

## 2017-09-02 NOTE — Progress Notes (Signed)
   SUBJECTIVE Patient presents to office today complaining of elongated, thickened nails that cause pain while ambulating in shoes. He is unable to trim his own nails. Patient is here for further evaluation and treatment.  Past Medical History:  Diagnosis Date  . Ambulates with cane 11/22/2016   In the 90s, patient injured his knee (ligament tear?) and declined surgery. Since then he has been using a cane to ambulate. Reports he is not in pain.   Marland Kitchen CAD (coronary artery disease) 11/22/2016   With Stent in 1998  . CHF (congestive heart failure) ([place]) 11/22/2016   Diagnosed in 1998  . Dementia 11/23/2016  . Former smoker 11/22/2016   Quit 1980s  . H/O partial resection of colon 11/22/2016   Due to polyp in 2004   . History of alcohol abuse 11/22/2016   1970s  . History of MI (myocardial infarction) 11/22/2016   2001  . History of prostate cancer 11/22/2016   S/p radiation in 2015? Was followed by previous oncologist yearly (Dr. Nona Dell in [place])   . History of stroke 11/22/2016  . HLD (hyperlipidemia) 11/22/2016  . HTN (hypertension) 11/22/2016  . Irregular cardiac rhythm 11/22/2016  . OSA (obstructive sleep apnea) 11/22/2016   History of OSA. Has not required CPAP since May 2018    OBJECTIVE General Patient is awake, alert, and oriented x 3 and in no acute distress. Derm Skin is dry and supple bilateral. Negative open lesions or macerations. Remaining integument unremarkable. Nails are tender, long, thickened and dystrophic with subungual debris, consistent with onychomycosis, 1-5 bilateral. No signs of infection noted. Vasc  DP and PT pedal pulses palpable bilaterally. Temperature gradient within normal limits.  Neuro Epicritic and protective threshold sensation diminished bilaterally.  Musculoskeletal Exam No symptomatic pedal deformities noted bilateral. Muscular strength within normal limits.  ASSESSMENT 1. Onychodystrophic nails 1-5 bilateral with hyperkeratosis of nails.  2.  Onychomycosis of nail due to dermatophyte bilateral 3. Pain in foot bilateral  PLAN OF CARE 1. Patient evaluated today.  2. Instructed to maintain good pedal hygiene and foot care.  3. Mechanical debridement of nails 1-5 bilaterally performed using a nail nipper. Filed with dremel without incident.  4. Return to clinic in 3 mos.    Edrick Kins, DPM [facility]  Dr. Edrick Kins, [address]                                        [place], [place] [address]                Office [phone]  Fax [phone]

## 2017-11-12 ENCOUNTER — Other Ambulatory Visit: Payer: Self-pay | Admitting: *Deleted

## 2017-11-12 DIAGNOSIS — I2510 Atherosclerotic heart disease of native coronary artery without angina pectoris: Secondary | ICD-10-CM

## 2017-11-14 MED ORDER — SIMVASTATIN 20 MG PO TABS: 20.0000 mg | ORAL_TABLET | Freq: Every day | ORAL | 3 refills | Status: AC

## 2017-11-19 NOTE — Progress Notes (Signed)
  Subjective:   Patient ID: Charles Chan    DOB: May 09, 1940, 77 y.o. male   MRN: [id number]  Charles Chan is a 77 y.o. male with a history of HTN, CHF, CAD, OSA, h/o prostate cancer, HLD, BPH, dementia here for   Hypertension: - Medications: norvasc 10mg  daily, metoprolol succinate 25mg  daily - Compliance: good - Checking BP at home: yes, 120s SBP. Once was 160 SBP. HH comes to check BP with no abnormals noted. - Denies any SOB, CP, vision changes, LE edema, medication SEs, or symptoms of hypotension - Exercise: doesn't always like to walk since getting demenia - Diet: eats generally well per wife.  - Last Cardiology visit 11/2016 with Holter monitor. Wife reports normal results with no medication adjustments. Was told he did not need follow up.   Review of Systems:  Per HPI.  [place], medications and smoking status reviewed.  Objective:   BP (!) 150/76   Pulse 65   Temp 98 F (36.7 C) (Oral)   Ht 6' (1.829 m)   Wt 186 lb 6.4 oz (84.6 kg)   SpO2 96%   BMI 25.28 kg/m  Vitals and nursing note reviewed.  General: well nourished, elderly male, in no acute distress with non-toxic appearance CV: regular rate with irregular rhythm without murmurs, rubs, or gallops, no lower extremity edema Lungs: clear to auscultation bilaterally with slighlty diminished breath sounds bilaterally, likely due to effort. Normal work of breathing with appropriate saturations on room air. Abdomen: soft, non-tender, non-distended, normoactive bowel sounds Skin: warm, dry, no rashes or lesions Extremities: warm and well perfused, normal tone MSK: ROM grossly intact, strength intact, gait slowed, ambulates with cane  Assessment & Plan:   Atrial flutter (HCC) Irregular rhythm today with normal rate. Last visit with Cardiology 11/2016 with Holter monitor. Wife reported normal results with no medication adjustment however per chart review, planned to change metoprolol XL to metoprolol tartrate  12.5mg  in the am only due to abnormal Holter results. Instructed to follow up with Cardiology for clarification. No medication adjustments made today.  HTN (hypertension) Slightly above goal today. However with low normal pulse and wider pulse pressure, no medication adjustments made today. Encouraged to follow up with Cardiology given previous abnormal Holter monitor results and possible medication adjustment. Return and emergency care precautions given.  No orders of the defined types were placed in this encounter.  No orders of the defined types were placed in this encounter.   Charles Percy, DO PGY-2, [facility] 11/20/2017 11:50 AM

## 2017-11-20 ENCOUNTER — Encounter: Payer: Self-pay | Admitting: Family Medicine

## 2017-11-20 ENCOUNTER — Ambulatory Visit (INDEPENDENT_AMBULATORY_CARE_PROVIDER_SITE_OTHER): Payer: Medicare Other | Admitting: Family Medicine

## 2017-11-20 ENCOUNTER — Other Ambulatory Visit: Payer: Self-pay

## 2017-11-20 VITALS — BP 150/76 | HR 65 | Temp 98.0°F | Ht 72.0 in | Wt 186.4 lb

## 2017-11-20 DIAGNOSIS — I10 Essential (primary) hypertension: Secondary | ICD-10-CM | POA: Diagnosis not present

## 2017-11-20 DIAGNOSIS — I4892 Unspecified atrial flutter: Secondary | ICD-10-CM | POA: Diagnosis not present

## 2017-11-20 NOTE — Assessment & Plan Note (Addendum)
Slightly above goal today. However with low normal pulse and wider pulse pressure, no medication adjustments made today. Encouraged to follow up with Cardiology given previous abnormal Holter monitor results and possible medication adjustment. Return and emergency care precautions given.

## 2017-11-20 NOTE — Assessment & Plan Note (Signed)
Irregular rhythm today with normal rate. Last visit with Cardiology 11/2016 with Holter monitor. Wife reported normal results with no medication adjustment however per chart review, planned to change metoprolol XL to metoprolol tartrate 12.5mg  in the am only due to abnormal Holter results. Instructed to follow up with Cardiology for clarification. No medication adjustments made today.

## 2017-11-20 NOTE — Patient Instructions (Signed)
It was great to see you!  Our plans for today:  - Call your cardiologist to follow up with heart monitor results and medication adjustment. No changes to medication today. - If you develop chest pain, shortness of breath, go to the ED.  Take care and seek immediate care sooner if you develop any concerns.   Dr. [facility]

## 2017-12-02 ENCOUNTER — Ambulatory Visit (INDEPENDENT_AMBULATORY_CARE_PROVIDER_SITE_OTHER): Payer: Medicare Other | Admitting: Podiatry

## 2017-12-02 DIAGNOSIS — M79676 Pain in unspecified toe(s): Secondary | ICD-10-CM | POA: Diagnosis not present

## 2017-12-02 DIAGNOSIS — B351 Tinea unguium: Secondary | ICD-10-CM | POA: Diagnosis not present

## 2017-12-04 NOTE — Progress Notes (Signed)
   SUBJECTIVE Patient presents to office today complaining of elongated, thickened nails that cause pain while ambulating in shoes. He is unable to trim his own nails. Patient is here for further evaluation and treatment.  Past Medical History:  Diagnosis Date  . Ambulates with cane 11/22/2016   In the 90s, patient injured his knee (ligament tear?) and declined surgery. Since then he has been using a cane to ambulate. Reports he is not in pain.   Marland Kitchen CAD (coronary artery disease) 11/22/2016   With Stent in 1998  . CHF (congestive heart failure) ([place]) 11/22/2016   Diagnosed in 1998  . Dementia 11/23/2016  . Former smoker 11/22/2016   Quit 1980s  . H/O partial resection of colon 11/22/2016   Due to polyp in 2004   . History of alcohol abuse 11/22/2016   1970s  . History of MI (myocardial infarction) 11/22/2016   2001  . History of prostate cancer 11/22/2016   S/p radiation in 2015? Was followed by previous oncologist yearly (Dr. Nona Dell in [place])   . History of stroke 11/22/2016  . HLD (hyperlipidemia) 11/22/2016  . HTN (hypertension) 11/22/2016  . Irregular cardiac rhythm 11/22/2016  . OSA (obstructive sleep apnea) 11/22/2016   History of OSA. Has not required CPAP since May 2018    OBJECTIVE General Patient is awake, alert, and oriented x 3 and in no acute distress. Derm Skin is dry and supple bilateral. Negative open lesions or macerations. Remaining integument unremarkable. Nails are tender, long, thickened and dystrophic with subungual debris, consistent with onychomycosis, 1-5 bilateral. No signs of infection noted. Vasc  DP and PT pedal pulses palpable bilaterally. Temperature gradient within normal limits.  Neuro Epicritic and protective threshold sensation grossly intact bilaterally.  Musculoskeletal Exam No symptomatic pedal deformities noted bilateral. Muscular strength within normal limits.  ASSESSMENT 1. Onychodystrophic nails 1-5 bilateral with hyperkeratosis of nails.  2.  Onychomycosis of nail due to dermatophyte bilateral 3. Pain in foot bilateral  PLAN OF CARE 1. Patient evaluated today.  2. Instructed to maintain good pedal hygiene and foot care.  3. Mechanical debridement of nails 1-5 bilaterally performed using a nail nipper. Filed with dremel without incident.  4. Return to clinic in 3 mos.    Edrick Kins, DPM [facility]  Dr. Edrick Kins, [place]                                        [place], [place] [address]                Office [phone]  Fax [phone]

## 2017-12-17 ENCOUNTER — Ambulatory Visit: Payer: Medicare Other | Admitting: Cardiology

## 2017-12-18 ENCOUNTER — Ambulatory Visit: Payer: Medicare Other | Admitting: Cardiology

## 2018-01-29 ENCOUNTER — Other Ambulatory Visit: Payer: Self-pay | Admitting: Family Medicine

## 2018-01-29 DIAGNOSIS — I10 Essential (primary) hypertension: Secondary | ICD-10-CM

## 2018-02-11 DIAGNOSIS — I351 Nonrheumatic aortic (valve) insufficiency: Secondary | ICD-10-CM | POA: Insufficient documentation

## 2018-03-03 ENCOUNTER — Ambulatory Visit: Payer: Medicare Other | Admitting: Podiatry

## 2018-04-07 ENCOUNTER — Other Ambulatory Visit: Payer: Self-pay | Admitting: Family Medicine

## 2018-04-07 DIAGNOSIS — I10 Essential (primary) hypertension: Secondary | ICD-10-CM

## 2018-06-30 ENCOUNTER — Other Ambulatory Visit: Payer: Self-pay | Admitting: Family Medicine

## 2018-06-30 DIAGNOSIS — I10 Essential (primary) hypertension: Secondary | ICD-10-CM

## 2018-09-22 ENCOUNTER — Emergency Department (HOSPITAL_COMMUNITY): Payer: Medicare Other

## 2018-09-22 ENCOUNTER — Encounter (HOSPITAL_COMMUNITY): Payer: Self-pay

## 2018-09-22 ENCOUNTER — Other Ambulatory Visit: Payer: Self-pay

## 2018-09-22 ENCOUNTER — Inpatient Hospital Stay (HOSPITAL_COMMUNITY)
Admission: EM | Admit: 2018-09-22 | Discharge: 2018-09-26 | DRG: 871 | Disposition: A | Discharge status: Home / Self Care | Payer: Medicare Other | Attending: Internal Medicine | Admitting: Internal Medicine

## 2018-09-22 ENCOUNTER — Inpatient Hospital Stay (HOSPITAL_COMMUNITY): Payer: Medicare Other

## 2018-09-22 DIAGNOSIS — J449 Chronic obstructive pulmonary disease, unspecified: Secondary | ICD-10-CM | POA: Diagnosis not present

## 2018-09-22 DIAGNOSIS — F0391 Unspecified dementia with behavioral disturbance: Secondary | ICD-10-CM | POA: Diagnosis not present

## 2018-09-22 DIAGNOSIS — R1114 Bilious vomiting: Secondary | ICD-10-CM | POA: Diagnosis not present

## 2018-09-22 DIAGNOSIS — Z85038 Personal history of other malignant neoplasm of large intestine: Secondary | ICD-10-CM

## 2018-09-22 DIAGNOSIS — Z1159 Encounter for screening for other viral diseases: Secondary | ICD-10-CM

## 2018-09-22 DIAGNOSIS — I4892 Unspecified atrial flutter: Secondary | ICD-10-CM | POA: Diagnosis present

## 2018-09-22 DIAGNOSIS — Z79899 Other long term (current) drug therapy: Secondary | ICD-10-CM

## 2018-09-22 DIAGNOSIS — N400 Enlarged prostate without lower urinary tract symptoms: Secondary | ICD-10-CM | POA: Diagnosis present

## 2018-09-22 DIAGNOSIS — I252 Old myocardial infarction: Secondary | ICD-10-CM | POA: Diagnosis not present

## 2018-09-22 DIAGNOSIS — G4733 Obstructive sleep apnea (adult) (pediatric): Secondary | ICD-10-CM | POA: Diagnosis present

## 2018-09-22 DIAGNOSIS — I700 Atherosclerosis of aorta: Secondary | ICD-10-CM | POA: Diagnosis present

## 2018-09-22 DIAGNOSIS — Z7901 Long term (current) use of anticoagulants: Secondary | ICD-10-CM

## 2018-09-22 DIAGNOSIS — E876 Hypokalemia: Secondary | ICD-10-CM | POA: Diagnosis present

## 2018-09-22 DIAGNOSIS — J440 Chronic obstructive pulmonary disease with acute lower respiratory infection: Secondary | ICD-10-CM | POA: Diagnosis present

## 2018-09-22 DIAGNOSIS — E860 Dehydration: Secondary | ICD-10-CM | POA: Diagnosis present

## 2018-09-22 DIAGNOSIS — I110 Hypertensive heart disease with heart failure: Secondary | ICD-10-CM | POA: Diagnosis present

## 2018-09-22 DIAGNOSIS — Z91013 Allergy to seafood: Secondary | ICD-10-CM

## 2018-09-22 DIAGNOSIS — Z66 Do not resuscitate: Secondary | ICD-10-CM | POA: Diagnosis present

## 2018-09-22 DIAGNOSIS — Z955 Presence of coronary angioplasty implant and graft: Secondary | ICD-10-CM | POA: Diagnosis not present

## 2018-09-22 DIAGNOSIS — I10 Essential (primary) hypertension: Secondary | ICD-10-CM | POA: Diagnosis present

## 2018-09-22 DIAGNOSIS — F0390 Unspecified dementia without behavioral disturbance: Secondary | ICD-10-CM | POA: Diagnosis present

## 2018-09-22 DIAGNOSIS — Z825 Family history of asthma and other chronic lower respiratory diseases: Secondary | ICD-10-CM

## 2018-09-22 DIAGNOSIS — A419 Sepsis, unspecified organism: Principal | ICD-10-CM | POA: Diagnosis present

## 2018-09-22 DIAGNOSIS — Z8673 Personal history of transient ischemic attack (TIA), and cerebral infarction without residual deficits: Secondary | ICD-10-CM | POA: Diagnosis not present

## 2018-09-22 DIAGNOSIS — I2510 Atherosclerotic heart disease of native coronary artery without angina pectoris: Secondary | ICD-10-CM | POA: Diagnosis present

## 2018-09-22 DIAGNOSIS — R112 Nausea with vomiting, unspecified: Secondary | ICD-10-CM | POA: Diagnosis not present

## 2018-09-22 DIAGNOSIS — Z8546 Personal history of malignant neoplasm of prostate: Secondary | ICD-10-CM

## 2018-09-22 DIAGNOSIS — J189 Pneumonia, unspecified organism: Secondary | ICD-10-CM | POA: Diagnosis present

## 2018-09-22 DIAGNOSIS — Z7982 Long term (current) use of aspirin: Secondary | ICD-10-CM

## 2018-09-22 DIAGNOSIS — J181 Lobar pneumonia, unspecified organism: Secondary | ICD-10-CM

## 2018-09-22 DIAGNOSIS — E785 Hyperlipidemia, unspecified: Secondary | ICD-10-CM | POA: Diagnosis present

## 2018-09-22 DIAGNOSIS — Z0189 Encounter for other specified special examinations: Secondary | ICD-10-CM

## 2018-09-22 DIAGNOSIS — Z8249 Family history of ischemic heart disease and other diseases of the circulatory system: Secondary | ICD-10-CM

## 2018-09-22 DIAGNOSIS — K56609 Unspecified intestinal obstruction, unspecified as to partial versus complete obstruction: Secondary | ICD-10-CM | POA: Diagnosis present

## 2018-09-22 DIAGNOSIS — K56600 Partial intestinal obstruction, unspecified as to cause: Secondary | ICD-10-CM | POA: Diagnosis present

## 2018-09-22 DIAGNOSIS — E782 Mixed hyperlipidemia: Secondary | ICD-10-CM

## 2018-09-22 DIAGNOSIS — J4489 Other specified chronic obstructive pulmonary disease: Secondary | ICD-10-CM | POA: Diagnosis present

## 2018-09-22 DIAGNOSIS — I509 Heart failure, unspecified: Secondary | ICD-10-CM | POA: Diagnosis present

## 2018-09-22 DIAGNOSIS — R7989 Other specified abnormal findings of blood chemistry: Secondary | ICD-10-CM | POA: Diagnosis present

## 2018-09-22 DIAGNOSIS — Z87891 Personal history of nicotine dependence: Secondary | ICD-10-CM

## 2018-09-22 DIAGNOSIS — R778 Other specified abnormalities of plasma proteins: Secondary | ICD-10-CM | POA: Diagnosis present

## 2018-09-22 DIAGNOSIS — Z8349 Family history of other endocrine, nutritional and metabolic diseases: Secondary | ICD-10-CM

## 2018-09-22 LAB — URINALYSIS, ROUTINE W REFLEX MICROSCOPIC
Bacteria, UA: NONE SEEN
Bilirubin Urine: NEGATIVE
Glucose, UA: NEGATIVE mg/dL
Hgb urine dipstick: NEGATIVE
Ketones, ur: NEGATIVE mg/dL
Leukocytes,Ua: NEGATIVE
Nitrite: NEGATIVE
Protein, ur: 30 mg/dL — AB
Specific Gravity, Urine: 1.044 — ABNORMAL HIGH (ref 1.005–1.030)
pH: 7 (ref 5.0–8.0)

## 2018-09-22 LAB — CBC WITH DIFFERENTIAL/PLATELET
Abs Immature Granulocytes: 0.09 10*3/uL — ABNORMAL HIGH (ref 0.00–0.07)
Basophils Absolute: 0 10*3/uL (ref 0.0–0.1)
Basophils Relative: 0 %
Eosinophils Absolute: 0 10*3/uL (ref 0.0–0.5)
Eosinophils Relative: 0 %
HCT: 50 % (ref 39.0–52.0)
Hemoglobin: 16.1 g/dL (ref 13.0–17.0)
Immature Granulocytes: 1 %
Lymphocytes Relative: 5 %
Lymphs Abs: 0.8 10*3/uL (ref 0.7–4.0)
MCH: 31.2 pg (ref 26.0–34.0)
MCHC: 32.2 g/dL (ref 30.0–36.0)
MCV: 96.9 fL (ref 80.0–100.0)
Monocytes Absolute: 0.8 10*3/uL (ref 0.1–1.0)
Monocytes Relative: 5 %
Neutro Abs: 15 10*3/uL — ABNORMAL HIGH (ref 1.7–7.7)
Neutrophils Relative %: 89 %
Platelets: 208 10*3/uL (ref 150–400)
RBC: 5.16 MIL/uL (ref 4.22–5.81)
RDW: 13.2 % (ref 11.5–15.5)
WBC: 16.8 10*3/uL — ABNORMAL HIGH (ref 4.0–10.5)
nRBC: 0 % (ref 0.0–0.2)

## 2018-09-22 LAB — COMPREHENSIVE METABOLIC PANEL
ALT: 11 U/L (ref 0–44)
AST: 18 U/L (ref 15–41)
Albumin: 4.2 g/dL (ref 3.5–5.0)
Alkaline Phosphatase: 84 U/L (ref 38–126)
Anion gap: 10 (ref 5–15)
BUN: 15 mg/dL (ref 8–23)
CO2: 22 mmol/L (ref 22–32)
Calcium: 9.3 mg/dL (ref 8.9–10.3)
Chloride: 110 mmol/L (ref 98–111)
Creatinine, Ser: 1.18 mg/dL (ref 0.61–1.24)
GFR calc Af Amer: >60 mL/min (ref >60)
GFR calc non Af Amer: >60 mL/min (ref >60)
Glucose, Bld: 119 mg/dL — ABNORMAL HIGH (ref 70–99)
Potassium: 3.6 mmol/L (ref 3.5–5.1)
Sodium: 142 mmol/L (ref 135–145)
Total Bilirubin: 1.3 mg/dL — ABNORMAL HIGH (ref 0.3–1.2)
Total Protein: 7.7 g/dL (ref 6.5–8.1)

## 2018-09-22 LAB — LACTIC ACID, PLASMA: Lactic Acid, Venous: 1.6 mmol/L (ref 0.5–1.9)

## 2018-09-22 LAB — MAGNESIUM: Magnesium: 1.9 mg/dL (ref 1.7–2.4)

## 2018-09-22 LAB — LIPASE, BLOOD: Lipase: 19 U/L (ref 11–51)

## 2018-09-22 LAB — SARS CORONAVIRUS 2 BY RT PCR (HOSPITAL ORDER, PERFORMED IN ~~LOC~~ HOSPITAL LAB): SARS Coronavirus 2: NEGATIVE

## 2018-09-22 LAB — TROPONIN I: Troponin I: 0.04 ng/mL (ref <0.03)

## 2018-09-22 MED ORDER — ACETAMINOPHEN 650 MG RE SUPP: 650.0000 mg | Freq: Four times a day (QID) | RECTAL | Status: DC | PRN

## 2018-09-22 MED ORDER — HYDROCODONE-ACETAMINOPHEN 5-325 MG PO TABS
1.0000 | ORAL_TABLET | ORAL | Status: DC | PRN
  Administered 2018-09-24 – 2018-09-25 (×2): 1 via ORAL
  Filled 2018-09-22 (×3): qty 1

## 2018-09-22 MED ORDER — IOHEXOL 300 MG/ML  SOLN
100.0000 mL | Freq: Once | INTRAMUSCULAR | Status: AC | PRN
  Administered 2018-09-22: 17:00:00 100 mL via INTRAVENOUS

## 2018-09-22 MED ORDER — SODIUM CHLORIDE 0.9 % IV SOLN
1.0000 g | INTRAVENOUS | Status: DC
  Administered 2018-09-24: 20:00:00 1 g via INTRAVENOUS
  Filled 2018-09-22: qty 1
  Filled 2018-09-22 (×2): qty 10

## 2018-09-22 MED ORDER — ONDANSETRON HCL 4 MG PO TABS: 4.0000 mg | ORAL_TABLET | Freq: Four times a day (QID) | ORAL | Status: DC | PRN

## 2018-09-22 MED ORDER — LABETALOL HCL 5 MG/ML IV SOLN
10.0000 mg | INTRAVENOUS | Status: DC | PRN
  Administered 2018-09-22: 10 mg via INTRAVENOUS
  Filled 2018-09-22 (×3): qty 4

## 2018-09-22 MED ORDER — POTASSIUM CHLORIDE 10 MEQ/100ML IV SOLN
10.0000 meq | INTRAVENOUS | Status: AC
  Administered 2018-09-22 (×2): 10 meq via INTRAVENOUS
  Filled 2018-09-22 (×2): qty 100

## 2018-09-22 MED ORDER — ONDANSETRON HCL 4 MG/2ML IJ SOLN: 4.0000 mg | Freq: Four times a day (QID) | INTRAMUSCULAR | Status: DC | PRN

## 2018-09-22 MED ORDER — SODIUM CHLORIDE 0.9 % IV SOLN
500.0000 mg | INTRAVENOUS | Status: DC
  Administered 2018-09-24 (×2): 500 mg via INTRAVENOUS
  Filled 2018-09-22 (×2): qty 500

## 2018-09-22 MED ORDER — SODIUM CHLORIDE 0.9 % IV BOLUS
500.0000 mL | Freq: Once | INTRAVENOUS | Status: AC
  Administered 2018-09-22: 20:00:00 500 mL via INTRAVENOUS

## 2018-09-22 MED ORDER — SODIUM CHLORIDE 0.9 % IV SOLN
500.0000 mg | Freq: Once | INTRAVENOUS | Status: AC
  Administered 2018-09-22: 20:00:00 500 mg via INTRAVENOUS
  Filled 2018-09-22: qty 500

## 2018-09-22 MED ORDER — ALBUTEROL SULFATE (2.5 MG/3ML) 0.083% IN NEBU: 2.5000 mg | INHALATION_SOLUTION | RESPIRATORY_TRACT | Status: DC | PRN

## 2018-09-22 MED ORDER — SODIUM CHLORIDE 0.9 % IV SOLN
1.0000 g | Freq: Once | INTRAVENOUS | Status: AC
  Administered 2018-09-22: 19:00:00 1 g via INTRAVENOUS
  Filled 2018-09-22: qty 10

## 2018-09-22 MED ORDER — MOMETASONE FURO-FORMOTEROL FUM 100-5 MCG/ACT IN AERO
2.0000 | INHALATION_SPRAY | Freq: Two times a day (BID) | RESPIRATORY_TRACT | Status: DC
  Administered 2018-09-22 – 2018-09-26 (×8): 2 via RESPIRATORY_TRACT
  Filled 2018-09-22: qty 8.8

## 2018-09-22 MED ORDER — SODIUM CHLORIDE (PF) 0.9 % IJ SOLN
INTRAMUSCULAR | Status: AC
  Filled 2018-09-22: qty 50

## 2018-09-22 MED ORDER — SODIUM CHLORIDE 0.9 % IV SOLN
INTRAVENOUS | Status: DC
  Administered 2018-09-22: 23:00:00 via INTRAVENOUS

## 2018-09-22 MED ORDER — ACETAMINOPHEN 325 MG PO TABS
650.0000 mg | ORAL_TABLET | Freq: Four times a day (QID) | ORAL | Status: DC | PRN
  Administered 2018-09-25: 650 mg via ORAL
  Filled 2018-09-22: qty 2

## 2018-09-22 NOTE — ED Notes (Addendum)
Pt has no complaints at this time. Pt oriented to self, place, situation. Pt denies emesis.

## 2018-09-22 NOTE — ED Notes (Signed)
Bed: WA06 Expected date:  Expected time:  Means of arrival:  Comments: EMS 77yo vom, diarrhea

## 2018-09-22 NOTE — ED Notes (Signed)
ED TO INPATIENT HANDOFF REPORT  Name/Age/Gender Charles Chan 78 y.o. male  Code Status Code Status History    Date Active Date Inactive Code Status Order ID Comments User Context   03/24/2017 2103 03/27/2017 1958 Partial Code [id number]  Tonette Bihari, MD Inpatient   03/24/2017 1929 03/24/2017 2102 Full Code [id number]  Tonette Bihari, MD Inpatient    Questions for Most Recent Historical Code Status (Order [id number])    Question Answer Comment   In the event of cardiac or respiratory ARREST: Initiate Code Blue, Call Rapid Response No    In the event of cardiac or respiratory ARREST: Perform CPR No    In the event of cardiac or respiratory ARREST: Perform Intubation/Mechanical Ventilation Yes    In the event of cardiac or respiratory ARREST: Use NIPPV/BiPAp only if indicated Yes    In the event of cardiac or respiratory ARREST: Administer ACLS medications if indicated Yes    In the event of cardiac or respiratory ARREST: Perform Defibrillation or Cardioversion if indicated Yes       Home/SNF/Other Home  Chief Complaint vomiting  Level of Care/Admitting Diagnosis ED Disposition    ED Disposition Condition [place]: [facility] [100102]  Level of Care: Telemetry [5]  Admit to tele based on following criteria: Other see comments  Comments: a.flutter  Covid Evaluation: N/A  Diagnosis: CAP (community acquired pneumonia) [177116]  Admitting Physician: Toy Baker [[id number]]  Attending Physician: Toy Baker [[id number]]  Estimated length of stay: 3 - 4 days  Certification:: I certify this patient will need inpatient services for at least 2 midnights  PT Class (Do Not Modify): Inpatient [101]  PT Acc Code (Do Not Modify): Private [1]       Medical History Past Medical History:  Diagnosis Date  . Ambulates with cane 11/22/2016   In the 90s, patient injured his knee (ligament tear?) and declined surgery. Since  then he has been using a cane to ambulate. Reports he is not in pain.   Marland Kitchen CAD (coronary artery disease) 11/22/2016   With Stent in 1998  . CHF (congestive heart failure) ([place]) 11/22/2016   Diagnosed in 1998  . Dementia ([place]) 11/23/2016  . Former smoker 11/22/2016   Quit 1980s  . H/O partial resection of colon 11/22/2016   Due to polyp in 2004   . History of alcohol abuse 11/22/2016   1970s  . History of MI (myocardial infarction) 11/22/2016   2001  . History of prostate cancer 11/22/2016   S/p radiation in 2015? Was followed by previous oncologist yearly (Dr. Nona Dell in [place])   . History of stroke 11/22/2016  . HLD (hyperlipidemia) 11/22/2016  . HTN (hypertension) 11/22/2016  . Irregular cardiac rhythm 11/22/2016  . OSA (obstructive sleep apnea) 11/22/2016   History of OSA. Has not required CPAP since May 2018    Allergies Allergies  Allergen Reactions  . Shellfish Allergy Hives    IV Location/Drains/Wounds Patient Lines/Drains/Airways Status   Active Line/Drains/Airways    Name:   Placement date:   Placement time:   Site:   Days:   Peripheral IV 09/22/18 Left Forearm   09/22/18    1625    Forearm   less than 1   NG/OG Tube Nasogastric 14 Fr. Right nare Xray;Aucultation Measured external length of tube 22 cm   09/22/18    2128    Right nare   less than 1  Labs/Imaging Results for orders placed or performed during the hospital encounter of 09/22/18 (from the past 48 hour(s))  Comprehensive metabolic panel     Status: Abnormal   Collection Time: 09/22/18  4:13 PM  Result Value Ref Range   Sodium 142 135 - 145 mmol/L   Potassium 3.6 3.5 - 5.1 mmol/L   Chloride 110 98 - 111 mmol/L   CO2 22 22 - 32 mmol/L   Glucose, Bld 119 (H) 70 - 99 mg/dL   BUN 15 8 - 23 mg/dL   Creatinine, Ser 1.18 0.61 - 1.24 mg/dL   Calcium 9.3 8.9 - 10.3 mg/dL   Total Protein 7.7 6.5 - 8.1 g/dL   Albumin 4.2 3.5 - 5.0 g/dL   AST 18 15 - 41 U/L   ALT 11 0 - 44 U/L   Alkaline Phosphatase 84 38  - 126 U/L   Total Bilirubin 1.3 (H) 0.3 - 1.2 mg/dL   GFR calc non Af Amer >60 >60 mL/min   GFR calc Af Amer >60 >60 mL/min   Anion gap 10 5 - 15    Comment: Performed at [facility], [place] [address] [place], [place] [address]  CBC with Differential     Status: Abnormal   Collection Time: 09/22/18  4:13 PM  Result Value Ref Range   WBC 16.8 (H) 4.0 - 10.5 K/uL   RBC 5.16 4.22 - 5.81 MIL/uL   Hemoglobin 16.1 13.0 - 17.0 g/dL   HCT 50.0 39.0 - 52.0 %   MCV 96.9 80.0 - 100.0 fL   MCH 31.2 26.0 - 34.0 pg   MCHC 32.2 30.0 - 36.0 g/dL   RDW 13.2 11.5 - 15.5 %   Platelets 208 150 - 400 K/uL   nRBC 0.0 0.0 - 0.2 %   Neutrophils Relative % 89 %   Neutro Abs 15.0 (H) 1.7 - 7.7 K/uL   Lymphocytes Relative 5 %   Lymphs Abs 0.8 0.7 - 4.0 K/uL   Monocytes Relative 5 %   Monocytes Absolute 0.8 0.1 - 1.0 K/uL   Eosinophils Relative 0 %   Eosinophils Absolute 0.0 0.0 - 0.5 K/uL   Basophils Relative 0 %   Basophils Absolute 0.0 0.0 - 0.1 K/uL   Immature Granulocytes 1 %   Abs Immature Granulocytes 0.09 (H) 0.00 - 0.07 K/uL    Comment: Performed at [facility], [place] [address] [place], [place] [address]  Lipase, blood     Status: None   Collection Time: 09/22/18  4:13 PM  Result Value Ref Range   Lipase 19 11 - 51 U/L    Comment: Performed at [facility], [place] [address] [place], [place] [address]  Urinalysis, Routine w reflex microscopic     Status: Abnormal   Collection Time: 09/22/18  4:13 PM  Result Value Ref Range   Color, Urine YELLOW YELLOW   APPearance HAZY (A) CLEAR   Specific Gravity, Urine 1.044 (H) 1.005 - 1.030   pH 7.0 5.0 - 8.0   Glucose, UA NEGATIVE NEGATIVE mg/dL   Hgb urine dipstick NEGATIVE NEGATIVE   Bilirubin Urine NEGATIVE NEGATIVE   Ketones, ur NEGATIVE NEGATIVE mg/dL   Protein, ur 30 (A) NEGATIVE mg/dL   Nitrite NEGATIVE NEGATIVE   Leukocytes,Ua NEGATIVE NEGATIVE   RBC / HPF 0-5 0 - 5 RBC/hpf   WBC,  UA 0-5 0 - 5 WBC/hpf   Bacteria, UA NONE SEEN NONE SEEN   Squamous Epithelial / LPF  0-5 0 - 5   Mucus PRESENT     Comment: Performed at [facility], [place] [address], [place], [place] [address]  SARS Coronavirus 2 (CEPHEID- Performed in [place] hospital lab), Hosp Order     Status: None   Collection Time: 09/22/18  4:14 PM  Result Value Ref Range   SARS Coronavirus 2 NEGATIVE NEGATIVE    Comment: (NOTE) If result is NEGATIVE SARS-CoV-2 target nucleic acids are NOT DETECTED. The SARS-CoV-2 RNA is generally detectable in upper and lower  respiratory specimens during the acute phase of infection. The lowest  concentration of SARS-CoV-2 viral copies this assay can detect is 250  copies / mL. A negative result does not preclude SARS-CoV-2 infection  and should not be used as the sole basis for treatment or other  patient management decisions.  A negative result may occur with  improper specimen collection / handling, submission of specimen other  than nasopharyngeal swab, presence of viral mutation(s) within the  areas targeted by this assay, and inadequate number of viral copies  (<250 copies / mL). A negative result must be combined with clinical  observations, patient history, and epidemiological information. If result is POSITIVE SARS-CoV-2 target nucleic acids are DETECTED. The SARS-CoV-2 RNA is generally detectable in upper and lower  respiratory specimens dur ing the acute phase of infection.  Positive  results are indicative of active infection with SARS-CoV-2.  Clinical  correlation with patient history and other diagnostic information is  necessary to determine patient infection status.  Positive results do  not rule out bacterial infection or co-infection with other viruses. If result is PRESUMPTIVE POSTIVE SARS-CoV-2 nucleic acids MAY BE PRESENT.   A presumptive positive result was obtained on the submitted specimen  and confirmed on repeat testing.   While 2019 novel coronavirus  (SARS-CoV-2) nucleic acids may be present in the submitted sample  additional confirmatory testing may be necessary for epidemiological  and / or clinical management purposes  to differentiate between  SARS-CoV-2 and other Sarbecovirus currently known to infect humans.  If clinically indicated additional testing with an alternate test  methodology [phone]) is advised. The SARS-CoV-2 RNA is generally  detectable in upper and lower respiratory sp ecimens during the acute  phase of infection. The expected result is Negative. Fact Sheet for Patients:  StrictlyIdeas.no Fact Sheet for Healthcare Providers: BankingDealers.co.za This test is not yet approved or cleared by the [place] FDA and has been authorized for detection and/or diagnosis of SARS-CoV-2 by FDA under an Emergency Use Authorization (EUA).  This EUA will remain in effect (meaning this test can be used) for the duration of the COVID-19 declaration under Section 564(b)(1) of the Act, 21 U.S.C. section 360bbb-3(b)(1), unless the authorization is terminated or revoked sooner. Performed at [facility], [place] [address] [place], [place] [address]   Lactic acid, plasma     Status: None   Collection Time: 09/22/18  6:17 PM  Result Value Ref Range   Lactic Acid, Venous 1.6 0.5 - 1.9 mmol/L    Comment: Performed at [facility], [place] [address] [place], [place] [address]   Dg Chest 2 View  Result Date: 09/22/2018 CLINICAL DATA:  Shortness of breath with cough and congestion. History of prostate cancer, myocardial infarction and dementia. EXAM: CHEST - 2 VIEW COMPARISON:  Radiographs 03/19/2018 and 03/24/2017. FINDINGS: There are lower lung volumes with mild elevation of the right hemidiaphragm. The heart size and mediastinal contours are stable.  There is mild cardiomegaly and aortic atherosclerosis. There is new  airspace opacity medially at the right lung base, projecting inferior to the hilum on the lateral view, and likely in the middle lobe. There is probable mild atelectasis at both lung bases. There is a stable bleb peripherally at the right lung base. No pneumothorax or significant pleural effusion. The bones appear unchanged. Telemetry leads overlie the chest. IMPRESSION: New right infrahilar airspace opacity, likely in the middle lobe, most consistent with early pneumonia. Followup PA and lateral chest X-ray is recommended in 3-4 weeks following trial of antibiotic therapy to ensure resolution and exclude underlying malignancy. Electronically Signed   By: Richardean Sale M.D.   On: 09/22/2018 16:58   Ct Abdomen Pelvis W Contrast  Result Date: 09/22/2018 CLINICAL DATA:  Vomiting and diarrhea today.  Fever yesterday. EXAM: CT ABDOMEN AND PELVIS WITH CONTRAST TECHNIQUE: Multidetector CT imaging of the abdomen and pelvis was performed using the standard protocol following bolus administration of intravenous contrast. CONTRAST:  166mL OMNIPAQUE IOHEXOL 300 MG/ML  SOLN COMPARISON:  Abdominopelvic CT 03/24/2017. FINDINGS: Lower chest: Advanced emphysematous changes at both lung bases with central airway thickening and fibrosis. Lucency anteriorly at the right costophrenic angle is unchanged, favored to reflect a bleb over a chronic loculated pneumothorax based on stability. No pleural or pericardial effusion. There is aortic and coronary artery atherosclerosis. Hepatobiliary: Multiple hepatic cysts are grossly stable. There are no suspicious liver lesions. There are multiple gallstones. No evidence of gallbladder wall thickening, surrounding inflammation or biliary dilatation. Pancreas: Unremarkable. No pancreatic ductal dilatation or surrounding inflammatory changes. Spleen: Normal in size without focal abnormality. Adrenals/Urinary Tract: Both adrenal glands appear normal. There are stable bilateral renal cysts,  largest in the lower pole of the left kidney. No evidence of urinary tract calculus or hydronephrosis. The bladder appears unremarkable. Stomach/Bowel: The stomach and proximal small bowel appear normal. There are multiple mildly dilated loops of small bowel which are fluid-filled with scattered air-fluid levels. There is gradual transition to normal caliber terminal ileum, and no focal transition point is identified. The colon is decompressed. The appendix appears normal. There are mild diverticular changes throughout the distal colon without wall thickening or surrounding inflammation. Vascular/Lymphatic: There are no enlarged abdominal or pelvic lymph nodes. Stable small lymph nodes in the porta hepatis. There is extensive aortic and branch vessel atherosclerosis. The left external iliac artery is completely occluded over an approximately 7 cm segment. There is distal reconstitution of the left femoral artery. The mesenteric vessels appear patent. Reproductive: Fiducial clips in the prostate gland. Other: There is no ascites, focal extraluminal fluid collection or pneumoperitoneum. No evidence of abdominal wall hernia. Musculoskeletal: No acute or significant osseous findings. Stable degenerative changes in the lumbar spine associated with a convex right scoliosis. There is a simple lipoma posteriorly in the lower right chest, superficial to the right scapular tip, incompletely visualized. IMPRESSION: 1. Findings suggest an early or partial distal small bowel obstruction, etiology not determined. 2. No evidence bowel perforation, abscess or acute inflammation. The appendix appears normal. 3. No evidence of metastatic prostate cancer. 4. Severe Aortic Atherosclerosis (ICD10-I70.0). 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 Coronary artery atherosclerosis. 5. Additional incidental findings including cholelithiasis, colonic diverticulosis and chronic basilar lung disease.  Electronically Signed   By: Richardean Sale M.D.   On: 09/22/2018 18:09    Pending Labs Unresulted Labs (From admission, onward)    Start     Ordered   09/22/18  2052  Magnesium  Add-on,   R     09/22/18 2051   09/22/18 2038  Troponin I - Add-On to previous collection  Add-on,   R     09/22/18 2037   09/22/18 2027  Gastrointestinal Panel by PCR , Stool  (Gastrointestinal Panel by PCR, Stool)  Once,   R     09/22/18 2026   Signed and Held  Culture, blood (routine x 2) Call MD if unable to obtain prior to antibiotics being given  BLOOD CULTURE X 2,   R    Comments:  If blood cultures drawn in Emergency Department - Do not draw and cancel order   Question:  Patient immune status  Answer:  Normal   Signed and Held   Signed and Held  Culture, sputum-assessment  Once,   R    Question:  Patient immune status  Answer:  Normal   Signed and Held   Signed and Held  Gram stain  Once,   R    Question:  Patient immune status  Answer:  Normal   Signed and Held   Signed and Held  Strep pneumoniae urinary antigen  Once,   R     Signed and Held   Signed and Held  HIV antibody (Routine Screening)  Tomorrow morning,   R     Signed and Held   Signed and Held  Legionella Pneumophila Serogp 1 Ur Ag  Once,   R     Signed and Held   Signed and Held  Magnesium  Tomorrow morning,   R    Comments:  Call MD if <1.5    Signed and Held   Signed and Held  Phosphorus  Tomorrow morning,   R     Signed and Held   Signed and Held  TSH  Once,   R    Comments:  Cancel if already done within 1 month and notify MD    Signed and Held   Signed and Held  Comprehensive metabolic panel  Once,   R    Comments:  Cal MD for K<3.5 or >5.0    Signed and Held   Signed and Held  CBC  Once,   R    Comments:  Call for hg <8.0    Signed and Held          Vitals/Pain Today's Vitals   09/22/18 2030 09/22/18 2110 09/22/18 2125 09/22/18 2200  BP: (!) 174/52 (!) 166/74  (!) 155/88  Pulse: 91 60  62  Resp:  15  16   Temp:   98.8 F (37.1 C)   TempSrc:      SpO2: 94% 95%  96%  Weight:      PainSc:        Isolation Precautions Enteric precautions (UV disinfection)  Medications Medications  sodium chloride (PF) 0.9 % injection (has no administration in time range)  labetalol (NORMODYNE) injection 10 mg (0 mg Intravenous Hold 09/22/18 2132)  potassium chloride 10 mEq in 100 mL IVPB (10 mEq Intravenous New Bag/Given 09/22/18 2138)  iohexol (OMNIPAQUE) 300 MG/ML solution 100 mL (100 mLs Intravenous Contrast Given 09/22/18 1725)  cefTRIAXone (ROCEPHIN) 1 g in sodium chloride 0.9 % 100 mL IVPB (0 g Intravenous Stopped 09/22/18 2000)  azithromycin (ZITHROMAX) 500 mg in sodium chloride 0.9 % 250 mL IVPB (0 mg Intravenous Stopped 09/22/18 2110)  sodium chloride 0.9 % bolus 500 mL (500 mLs Intravenous New Bag/Given 09/22/18 2014)    Mobility  walks with device

## 2018-09-22 NOTE — ED Notes (Signed)
Date and time results received: 09/22/18 2210 (use smartphrase ".now" to insert current time)  Test: Troponin Critical Value: 0.04  Name of Provider Notified: Dr.Doutova  Orders Received? Or Actions Taken?:

## 2018-09-22 NOTE — ED Provider Notes (Signed)
[facility] DEPT Provider Note   CSN: [id number] Arrival date & time: 09/22/18  1529    History   Chief Complaint Chief Complaint  Patient presents with   Emesis    HPI Charles Chan is a 78 y.o. male.     Patient is a 78 year old male with a history of dementia, CHF, prior alcohol abuse, hypertension, hyperlipidemia, stroke who presents with nausea vomiting.  History is limited as patient has dementia and there is no family at bedside.  Per EMS patient has had vomiting and diarrhea since this morning.  He had a reported fever of 101.1 yesterday.  Patient denies any complaints to me.     Past Medical History:  Diagnosis Date   Ambulates with cane 11/22/2016   In the 90s, patient injured his knee (ligament tear?) and declined surgery. Since then he has been using a cane to ambulate. Reports he is not in pain.    CAD (coronary artery disease) 11/22/2016   With Stent in 1998   CHF (congestive heart failure) ([place]) 11/22/2016   Diagnosed in 1998   Dementia ([place]) 11/23/2016   Former smoker 11/22/2016   Quit 1980s   H/O partial resection of colon 11/22/2016   Due to polyp in 2004    History of alcohol abuse 11/22/2016   1970s   History of MI (myocardial infarction) 11/22/2016   2001   History of prostate cancer 11/22/2016   S/p radiation in 2015? Was followed by previous oncologist yearly (Dr. Nona Dell in [place])    History of stroke 11/22/2016   HLD (hyperlipidemia) 11/22/2016   HTN (hypertension) 11/22/2016   Irregular cardiac rhythm 11/22/2016   OSA (obstructive sleep apnea) 11/22/2016   History of OSA. Has not required CPAP since May 2018    Patient Active Problem List   Diagnosis Date Noted   CAP (community acquired pneumonia) 09/22/2018   SBO (small bowel obstruction) ([place]) 09/22/2018   COPD with chronic bronchitis ([place]) 09/22/2018   Nausea & vomiting 09/22/2018   Dehydration 09/22/2018   Elevated troponin  09/22/2018   BPH (benign prostatic hyperplasia) 07/03/2017   Diverticulosis of colon 04/09/2017   Internal hemorrhoids 04/09/2017   Atrial flutter ([place]) 11/26/2016   Dementia ([place]) 11/23/2016   Ambulates with cane 11/22/2016   History of stroke 11/22/2016   OSA (obstructive sleep apnea) 11/22/2016   HTN (hypertension) 11/22/2016   HLD (hyperlipidemia) 11/22/2016   History of MI (myocardial infarction) 11/22/2016   CHF (congestive heart failure) ([place]) 11/22/2016   History of prostate cancer 11/22/2016   History of alcohol abuse 11/22/2016   CAD (coronary artery disease) 11/22/2016   H/O partial resection of colon 11/22/2016   Former smoker 11/22/2016   Irregular cardiac rhythm 11/22/2016    Past Surgical History:  Procedure Laterality Date   COLON RESECTION  2004   CORONARY ANGIOPLASTY WITH STENT PLACEMENT          Home Medications    Prior to Admission medications   Medication Sig Start Date End Date Taking? Authorizing Provider  albuterol (PROVENTIL) (2.5 MG/3ML) 0.083% nebulizer solution Take 2.5 mg by nebulization at bedtime.  07/23/18  Yes [provider]  amLODipine (NORVASC) 10 MG tablet TAKE 1 TABLET BY MOUTH ONCE DAILY 06/30/18  Yes Rory Percy, DO  aspirin 81 MG EC tablet Take 81 mg by mouth daily.    Yes [provider]  ELIQUIS 5 MG TABS tablet Take 5 mg by mouth 2 (two) times daily. 09/06/18  Yes  [provider]  hydrOXYzine (ATARAX/VISTARIL) 10 MG tablet Take 10 mg by mouth at bedtime as needed for sleep. 08/21/18  Yes [provider]  metoprolol succinate (TOPROL-XL) 25 MG 24 hr tablet Take 1 tablet (25 mg total) by mouth daily. 08/30/17  Yes Dickie La, MD  simvastatin (ZOCOR) 20 MG tablet Take 1 tablet (20 mg total) by mouth daily at 6 PM. 11/14/17  Yes Rumball, Bryson Ha, DO  tamsulosin (FLOMAX) 0.4 MG CAPS capsule Take 0.4 mg by mouth daily.   Yes [provider]  docusate sodium (COLACE) 100 MG  capsule Take 1 capsule (100 mg total) by mouth daily. Patient not taking: Reported on 09/22/2018 08/30/17   Dickie La, MD  nitroGLYCERIN (NITROSTAT) 0.4 MG SL tablet Place 1 tablet (0.4 mg total) under the tongue every 5 (five) minutes as needed for chest pain. 09/02/17   Smiley Houseman, MD    Family History Family History  Problem Relation Age of Onset   Hyperlipidemia Mother    Hypertension Mother    Alcohol abuse Father    Drug abuse Father    Heart disease Father        passed away in his 66s due to MI   Asthma Sister    Healthy Brother     Social History Social History   Tobacco Use   Smoking status: Former Smoker    Last attempt to quit: 1980    Years since quitting: 40.4   Smokeless tobacco: Never Used  Substance Use Topics   Alcohol use: No   Drug use: No     Allergies   Shellfish allergy   Review of Systems Review of Systems  Unable to perform ROS: Dementia     Physical Exam Updated Vital Signs BP (!) 185/79 (BP Location: Left Arm)    Pulse 72    Temp 98 F (36.7 C) (Oral)    Resp (!) 22    Wt 85 kg    SpO2 95%    BMI 25.41 kg/m   Physical Exam Constitutional:      Appearance: He is well-developed.  HENT:     Head: Normocephalic and atraumatic.  Eyes:     Pupils: Pupils are equal, round, and reactive to light.  Neck:     Musculoskeletal: Normal range of motion and neck supple.  Cardiovascular:     Rate and Rhythm: Normal rate and regular rhythm.     Heart sounds: Normal heart sounds.  Pulmonary:     Effort: Pulmonary effort is normal. Tachypnea present. No respiratory distress.     Breath sounds: Rhonchi present. No wheezing or rales.  Chest:     Chest wall: No tenderness.  Abdominal:     General: Bowel sounds are normal.     Palpations: Abdomen is soft.     Tenderness: There is abdominal tenderness (Generalized tenderness, but more on the right side). There is no guarding or rebound.  Musculoskeletal: Normal range of  motion.  Lymphadenopathy:     Cervical: No cervical adenopathy.  Skin:    General: Skin is warm and dry.     Findings: No rash.  Neurological:     Mental Status: He is alert.     Comments: Awake and alert, oriented to person and place only      ED Treatments / Results  Labs (all labs ordered are listed, but only abnormal results are displayed) Labs Reviewed  COMPREHENSIVE METABOLIC PANEL - Abnormal; Notable for the following components:  Result Value   Glucose, Bld 119 (*)    Total Bilirubin 1.3 (*)    All other components within normal limits  CBC WITH DIFFERENTIAL/PLATELET - Abnormal; Notable for the following components:   WBC 16.8 (*)    Neutro Abs 15.0 (*)    Abs Immature Granulocytes 0.09 (*)    All other components within normal limits  URINALYSIS, ROUTINE W REFLEX MICROSCOPIC - Abnormal; Notable for the following components:   APPearance HAZY (*)    Specific Gravity, Urine 1.044 (*)    Protein, ur 30 (*)    All other components within normal limits  TROPONIN I - Abnormal; Notable for the following components:   Troponin I 0.04 (*)    All other components within normal limits  SARS CORONAVIRUS 2 (HOSPITAL ORDER, [facility])  GASTROINTESTINAL PANEL BY PCR, STOOL (REPLACES STOOL CULTURE)  CULTURE, BLOOD (ROUTINE X 2)  CULTURE, BLOOD (ROUTINE X 2)  EXPECTORATED SPUTUM ASSESSMENT W REFEX TO RESP CULTURE  GRAM STAIN  LIPASE, BLOOD  LACTIC ACID, PLASMA  MAGNESIUM  TROPONIN I  TROPONIN I  STREP PNEUMONIAE URINARY ANTIGEN  HIV ANTIBODY (ROUTINE TESTING W REFLEX)  LEGIONELLA PNEUMOPHILA SEROGP 1 UR AG  MAGNESIUM  PHOSPHORUS  TSH  COMPREHENSIVE METABOLIC PANEL  CBC    EKG EKG Interpretation  Date/Time:  Monday Sep 22 2018 16:26:47 EDT Ventricular Rate:  93 PR Interval:    QRS Duration: 111 QT Interval:  327 QTC Calculation: 366 R Axis:     Text Interpretation:  Atrial flutter Abnormal R-wave progression, late transition  Lateral leads are also involved since last tracing no significant change Confirmed by Malvin Johns [phone]) on 09/22/2018 4:38:24 PM   Radiology Dg Chest 2 View  Result Date: 09/22/2018 CLINICAL DATA:  Shortness of breath with cough and congestion. History of prostate cancer, myocardial infarction and dementia. EXAM: CHEST - 2 VIEW COMPARISON:  Radiographs 03/19/2018 and 03/24/2017. FINDINGS: There are lower lung volumes with mild elevation of the right hemidiaphragm. The heart size and mediastinal contours are stable. There is mild cardiomegaly and aortic atherosclerosis. There is new airspace opacity medially at the right lung base, projecting inferior to the hilum on the lateral view, and likely in the middle lobe. There is probable mild atelectasis at both lung bases. There is a stable bleb peripherally at the right lung base. No pneumothorax or significant pleural effusion. The bones appear unchanged. Telemetry leads overlie the chest. IMPRESSION: New right infrahilar airspace opacity, likely in the middle lobe, most consistent with early pneumonia. Followup PA and lateral chest X-ray is recommended in 3-4 weeks following trial of antibiotic therapy to ensure resolution and exclude underlying malignancy. Electronically Signed   By: Richardean Sale M.D.   On: 09/22/2018 16:58   Dg Abdomen 1 View  Result Date: 09/22/2018 CLINICAL DATA:  NG tube placement EXAM: ABDOMEN - 1 VIEW COMPARISON:  None. FINDINGS: The enteric tube terminates over the stomach. There are multiple dilated loops of small bowel. There is no pneumatosis or free air. IMPRESSION: NG tube projects over the stomach. Electronically Signed   By: Constance Holster M.D.   On: 09/22/2018 22:06   Ct Abdomen Pelvis W Contrast  Result Date: 09/22/2018 CLINICAL DATA:  Vomiting and diarrhea today.  Fever yesterday. EXAM: CT ABDOMEN AND PELVIS WITH CONTRAST TECHNIQUE: Multidetector CT imaging of the abdomen and pelvis was performed using the  standard protocol following bolus administration of intravenous contrast. CONTRAST:  13mL OMNIPAQUE IOHEXOL 300 MG/ML  SOLN COMPARISON:  Abdominopelvic CT 03/24/2017. FINDINGS: Lower chest: Advanced emphysematous changes at both lung bases with central airway thickening and fibrosis. Lucency anteriorly at the right costophrenic angle is unchanged, favored to reflect a bleb over a chronic loculated pneumothorax based on stability. No pleural or pericardial effusion. There is aortic and coronary artery atherosclerosis. Hepatobiliary: Multiple hepatic cysts are grossly stable. There are no suspicious liver lesions. There are multiple gallstones. No evidence of gallbladder wall thickening, surrounding inflammation or biliary dilatation. Pancreas: Unremarkable. No pancreatic ductal dilatation or surrounding inflammatory changes. Spleen: Normal in size without focal abnormality. Adrenals/Urinary Tract: Both adrenal glands appear normal. There are stable bilateral renal cysts, largest in the lower pole of the left kidney. No evidence of urinary tract calculus or hydronephrosis. The bladder appears unremarkable. Stomach/Bowel: The stomach and proximal small bowel appear normal. There are multiple mildly dilated loops of small bowel which are fluid-filled with scattered air-fluid levels. There is gradual transition to normal caliber terminal ileum, and no focal transition point is identified. The colon is decompressed. The appendix appears normal. There are mild diverticular changes throughout the distal colon without wall thickening or surrounding inflammation. Vascular/Lymphatic: There are no enlarged abdominal or pelvic lymph nodes. Stable small lymph nodes in the porta hepatis. There is extensive aortic and branch vessel atherosclerosis. The left external iliac artery is completely occluded over an approximately 7 cm segment. There is distal reconstitution of the left femoral artery. The mesenteric vessels appear  patent. Reproductive: Fiducial clips in the prostate gland. Other: There is no ascites, focal extraluminal fluid collection or pneumoperitoneum. No evidence of abdominal wall hernia. Musculoskeletal: No acute or significant osseous findings. Stable degenerative changes in the lumbar spine associated with a convex right scoliosis. There is a simple lipoma posteriorly in the lower right chest, superficial to the right scapular tip, incompletely visualized. IMPRESSION: 1. Findings suggest an early or partial distal small bowel obstruction, etiology not determined. 2. No evidence bowel perforation, abscess or acute inflammation. The appendix appears normal. 3. No evidence of metastatic prostate cancer. 4. Severe Aortic Atherosclerosis (ICD10-I70.0). 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 Coronary artery atherosclerosis. 5. Additional incidental findings including cholelithiasis, colonic diverticulosis and chronic basilar lung disease. Electronically Signed   By: Richardean Sale M.D.   On: 09/22/2018 18:09    Procedures Procedures (including critical care time)  Medications Ordered in ED Medications  sodium chloride (PF) 0.9 % injection (has no administration in time range)  cefTRIAXone (ROCEPHIN) 1 g in sodium chloride 0.9 % 100 mL IVPB (has no administration in time range)  azithromycin (ZITHROMAX) 500 mg in sodium chloride 0.9 % 250 mL IVPB (has no administration in time range)  labetalol (NORMODYNE) injection 10 mg (0 mg Intravenous Hold 09/22/18 2132)  albuterol (PROVENTIL) (2.5 MG/3ML) 0.083% nebulizer solution 2.5 mg (has no administration in time range)  mometasone-formoterol (DULERA) 100-5 MCG/ACT inhaler 2 puff (has no administration in time range)  acetaminophen (TYLENOL) tablet 650 mg (has no administration in time range)    Or  acetaminophen (TYLENOL) suppository 650 mg (has no administration in time range)  HYDROcodone-acetaminophen (NORCO/VICODIN)  5-325 MG per tablet 1-2 tablet (has no administration in time range)  ondansetron (ZOFRAN) tablet 4 mg (has no administration in time range)    Or  ondansetron (ZOFRAN) injection 4 mg (has no administration in time range)  0.9 %  sodium chloride infusion (has no administration in time range)  potassium chloride 10 mEq in 100 mL IVPB (  10 mEq Intravenous New Bag/Given 09/22/18 2138)  iohexol (OMNIPAQUE) 300 MG/ML solution 100 mL (100 mLs Intravenous Contrast Given 09/22/18 1725)  cefTRIAXone (ROCEPHIN) 1 g in sodium chloride 0.9 % 100 mL IVPB (0 g Intravenous Stopped 09/22/18 2000)  azithromycin (ZITHROMAX) 500 mg in sodium chloride 0.9 % 250 mL IVPB (0 mg Intravenous Stopped 09/22/18 2110)  sodium chloride 0.9 % bolus 500 mL (500 mLs Intravenous New Bag/Given 09/22/18 2014)     Initial Impression / Assessment and Plan / ED Course  I have reviewed the triage vital signs and the nursing notes.  Pertinent labs & imaging results that were available during my care of the patient were reviewed by me and considered in my medical decision making (see chart for details).        Patient is a 78 year old male who presents with vomiting and diarrhea.  Also has a cough.  Chest x-ray shows evidence of pneumonia.  He was started on Rocephin and Zithromax.  There is no suggestions of sepsis.  His troponin is mildly elevated.  His abdomen was tenderness CT scan was performed which shows evidence of an early or partial small bowel obstruction.  His other labs are non-concerning other than his white count is elevated.  I spoke with Dr. Roel Cluck who admit the patient for further treatment.  Final Clinical Impressions(s) / ED Diagnoses   Final diagnoses:  SBO (small bowel obstruction) [facility])    ED Discharge Orders    None       Malvin Johns, MD 09/22/18 2259

## 2018-09-22 NOTE — ED Triage Notes (Addendum)
Pt BIBA from home. Pt's family states vomiting and diarrhea since this AM. Pt c/o fever yesterday 101.1 . Pt was 97.9 with EMS. No vomiting with EMS. Pt denies N/V/body aches. A&Ox3 (baseline) per family.

## 2018-09-22 NOTE — H&P (Signed)
[name] XKG:[id number] DOB: [birth year] DOA: 09/22/2018     PCP: Patient, No Pcp Per   Outpatient Specialists:  CARDS:   Dr. Marijo File, Hildred Laser, MD     Patient arrived to ER on 09/22/18 at 1529  Patient coming from: home Lives  With family wife  Chief Complaint:  Chief Complaint  Patient presents with   Emesis    HPI: Charles Chan is a 78 y.o. male with medical history significant of HTN, CAD, prostate cancer, dementia, HLD, HTN, OSA not on CPAP Atrial flutter   Presented with started since yesterday   Nausea, vomiting, diarrhea   this morning initially better but the reccured.  yesterday had a fever up to 101.1 today down to 97.9 no vomiting since arrival to emergency department patient has dementia unable to provide detailed history.  Per family no chest pain. He has chronic cough for the past 1 year  History obtained through chart Wife reports he has had regular BM up until 2 days  Wife reports hx of colon cancer needing surgery in [place]  in 1990's-2000 and prostate surgery in 2008   Infectious risk factors:  Reports  fever, shortness of breath, dry cough  N/V/Diarrhea/abdominal pain,    In RAPID COVID TEST NEGATIVE     Regarding pertinent Chronic problems:     Hyperlipidemia -  on statins Simvastatin   HTN on amlodipine ,    Toprolol   CHF  last echo 12/10/2016  LV EF: 55% -   60% moderate concentric hypertrophy.    CAD  - On Aspirin, statin, betablocker,   sp stent                  -  followed by cardiology   OSA  Not on CPAP  BPH on flomax    A. 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 Chest x-ray showing right infrahilar airspace opacity middle lobe early pneumonia. CT  abdomen showed early or partial distal small bowel obstruction severe aortic atherosclerosis  The following Work up has been ordered so far:  Orders Placed This Encounter  Procedures   SARS Coronavirus 2 (CEPHEID- Performed in [place] hospital lab), Hosp Order   Gastrointestinal Panel by PCR , Stool   DG Chest 2 View   CT Abdomen Pelvis W Contrast   Comprehensive metabolic panel   CBC with Differential   Lipase, blood   Urinalysis, Routine w reflex microscopic   Troponin I - Add-On to previous collection   Cardiac monitoring   Consult to hospitalist   Enteric precautions (UV disinfection) C difficile, Norovirus   ED EKG   EKG 12-Lead   Admit to Inpatient (patient's expected length of stay will be greater than 2 midnights or inpatient only procedure)     Following Medications were ordered in ER: Medications  sodium chloride (PF) 0.9 % injection (has no administration in time range)  azithromycin (ZITHROMAX) 500 mg in sodium chloride 0.9 % 250 mL IVPB (500 mg Intravenous New Bag/Given 09/22/18 2006)  iohexol (OMNIPAQUE) 300 MG/ML solution 100 mL (100 mLs Intravenous Contrast  Given 09/22/18 1725)  cefTRIAXone (ROCEPHIN) 1 g in sodium chloride 0.9 % 100 mL IVPB (0 g Intravenous Stopped 09/22/18 2000)  sodium chloride 0.9 % bolus 500 mL (500 mLs Intravenous New Bag/Given 09/22/18 2014)        Consult Orders  (From admission, onward)         Start     Ordered   09/22/18 1915  Consult to hospitalist  Once    Provider:  Toy Baker, MD  Question Answer Comment  Place call to: [facility]   Reason for Consult Admit      09/22/18 1914         Significant initial  Findings: Abnormal Labs Reviewed  COMPREHENSIVE METABOLIC PANEL - Abnormal; Notable for the following components:      Result Value   Glucose, Bld 119 (*)    Total Bilirubin 1.3 (*)    All other components within normal limits  CBC WITH DIFFERENTIAL/PLATELET - Abnormal; Notable for  the following components:   WBC 16.8 (*)    Neutro Abs 15.0 (*)    Abs Immature Granulocytes 0.09 (*)    All other components within normal limits  URINALYSIS, ROUTINE W REFLEX MICROSCOPIC - Abnormal; Notable for the following components:   APPearance HAZY (*)    Specific Gravity, Urine 1.044 (*)    Protein, ur 30 (*)    All other components within normal limits      Otherwise labs showing:    Recent Labs  Lab 09/22/18 1613  NA 142  K 3.6  CO2 22  GLUCOSE 119*  BUN 15  CREATININE 1.18  CALCIUM 9.3    Cr   Stable,  Lab Results  Component Value Date   CREATININE 1.18 09/22/2018   CREATININE 1.23 03/26/2017   CREATININE 1.16 03/25/2017    Recent Labs  Lab 09/22/18 1613  AST 18  ALT 11  ALKPHOS 84  BILITOT 1.3*  PROT 7.7  ALBUMIN 4.2   Lab Results  Component Value Date   CALCIUM 9.3 09/22/2018    WBC      Component Value Date/Time   WBC 16.8 (H) 09/22/2018 1613   ANC    Component Value Date/Time   NEUTROABS 15.0 (H) 09/22/2018 1613     Lactic Acid, Venous    Component Value Date/Time   LATICACIDVEN 1.6 09/22/2018 1817      Lab Results  Component Value Date   SARSCOV2NAA NEGATIVE 09/22/2018      HG/HCT  Stable,     Component Value Date/Time   HGB 16.1 09/22/2018 1613   HGB 15.8 11/22/2016 1233   HCT 50.0 09/22/2018 1613   HCT 48.0 11/22/2016 1233    Recent Labs  Lab 09/22/18 1613  LIPASE 19        UA  no evidence of UTI     Urine analysis:    Component Value Date/Time   COLORURINE YELLOW 09/22/2018 1613   APPEARANCEUR HAZY (A) 09/22/2018 1613   LABSPEC 1.044 (H) 09/22/2018 1613   PHURINE 7.0 09/22/2018 1613   GLUCOSEU NEGATIVE 09/22/2018 1613   HGBUR NEGATIVE 09/22/2018 1613   BILIRUBINUR NEGATIVE 09/22/2018 [place] 09/22/2018 1613   PROTEINUR 30 (A) 09/22/2018 1613   NITRITE NEGATIVE 09/22/2018 1613   LEUKOCYTESUR NEGATIVE 09/22/2018 1613     CXR - middle lobe  CTabd/pelvis - partial SBO     ECG:  Personally reviewed by me showing: HR : 94 Rhythm:  A flutter .    ,  no evidence of ischemic changes [id number]       ED Triage Vitals  Enc Vitals Group     BP 09/22/18 1544 (!) 160/72     Pulse Rate 09/22/18 1544 75     Resp 09/22/18 1544 (!) 27     Temp 09/22/18 1544 98.3 F (36.8 C)     Temp Source 09/22/18 1544 Oral     SpO2 09/22/18 1544 93 %     Weight 09/22/18 1541 187 lb 6.3 oz (85 kg)     Height --      Head Circumference --      Peak Flow --      Pain Score 09/22/18 1540 0     Pain Loc --      Pain Edu? --      Excl. in [place]? --   TMAX(24)@       Latest  Blood pressure (!) 188/81, pulse 89, temperature 98.3 F (36.8 C), temperature source Oral, resp. rate (!) 117, weight 85 kg, SpO2 95 %.     Hospitalist was called for admission for dehydration nausea vomiting abdominal pain evidence of partial small bowel obstruction and community-acquired pneumonia   Review of Systems:    Pertinent positives include: Fevers, chills, fatigue,  non-productive cough, abdominal pain, nausea, vomiting, diarrhea, Constitutional:  No weight loss, night sweats, weight loss  HEENT:  No headaches, Difficulty swallowing,Tooth/dental problems,Sore throat,  No sneezing, itching, ear ache, nasal congestion, post nasal drip,  Cardio-vascular:  No chest pain, Orthopnea, PND, anasarca, dizziness, palpitations.no Bilateral lower extremity swelling  GI:  No heartburn, indigestion,  change in bowel habits, loss of appetite, melena, blood in stool, hematemesis Resp:  no shortness of breath at rest. No dyspnea on exertion, No excess mucus, no productive cough, No No coughing up of blood.No change in color of mucus.No wheezing. Skin:  no rash or lesions. No jaundice GU:  no dysuria, change in color of urine, no urgency or frequency. No straining to urinate.  No flank pain.  Musculoskeletal:  No joint pain or no joint swelling. No decreased range of motion. No back pain.  Psych:   No change in mood or affect. No depression or anxiety. No memory loss.  Neuro: no localizing neurological complaints, no tingling, no weakness, no double vision, no gait abnormality, no slurred speech, no confusion  All systems reviewed and apart from [name] all are negative  Past Medical History:   Past Medical History:  Diagnosis Date   Ambulates with cane 11/22/2016   In the 90s, patient injured his knee (ligament tear?) and declined surgery. Since then he has been using a cane to ambulate. Reports he is not in pain.    CAD (coronary artery disease) 11/22/2016   With Stent in 1998   CHF (congestive heart failure) ([place]) 11/22/2016   Diagnosed in 1998   Dementia ([place]) 11/23/2016   Former smoker 11/22/2016   Quit 1980s   H/O partial resection of colon 11/22/2016   Due to polyp in 2004    History of alcohol abuse 11/22/2016   1970s   History of MI (myocardial infarction) 11/22/2016   2001   History of prostate cancer 11/22/2016   S/p radiation in 2015? Was followed by previous oncologist yearly (Dr. Nona Dell in [place])    History of stroke 11/22/2016   HLD (hyperlipidemia) 11/22/2016   HTN (hypertension) 11/22/2016   Irregular cardiac rhythm 11/22/2016   OSA (obstructive sleep apnea) 11/22/2016   History of OSA. Has not  required CPAP since May 2018      Past Surgical History:  Procedure Laterality Date   COLON RESECTION  2004   CORONARY ANGIOPLASTY WITH STENT PLACEMENT      Social History:  Ambulatory cane, walker     reports that he quit smoking about 40 years ago. He has never used smokeless tobacco. He reports that he does not drink alcohol or use drugs.  Family History:   Family History  Problem Relation Age of Onset   Hyperlipidemia Mother    Hypertension Mother    Alcohol abuse Father    Drug abuse Father    Heart disease Father        passed away in his 58s due to MI   Asthma Sister    Healthy Brother     Allergies: Allergies  Allergen  Reactions   Shellfish Allergy Hives     Prior to Admission medications   Medication Sig Start Date End Date Taking? Authorizing Provider  albuterol (PROVENTIL) (2.5 MG/3ML) 0.083% nebulizer solution Take 2.5 mg by nebulization at bedtime.  07/23/18  Yes [provider]  amLODipine (NORVASC) 10 MG tablet TAKE 1 TABLET BY MOUTH ONCE DAILY 06/30/18  Yes Rory Percy, DO  aspirin 81 MG EC tablet Take 81 mg by mouth daily.    Yes [provider]  ELIQUIS 5 MG TABS tablet Take 5 mg by mouth 2 (two) times daily. 09/06/18  Yes [provider]  hydrOXYzine (ATARAX/VISTARIL) 10 MG tablet Take 10 mg by mouth at bedtime as needed for sleep. 08/21/18  Yes [provider]  metoprolol succinate (TOPROL-XL) 25 MG 24 hr tablet Take 1 tablet (25 mg total) by mouth daily. 08/30/17  Yes Dickie La, MD  simvastatin (ZOCOR) 20 MG tablet Take 1 tablet (20 mg total) by mouth daily at 6 PM. 11/14/17  Yes Rumball, Bryson Ha, DO  tamsulosin (FLOMAX) 0.4 MG CAPS capsule Take 0.4 mg by mouth daily.   Yes [provider]  docusate sodium (COLACE) 100 MG capsule Take 1 capsule (100 mg total) by mouth daily. Patient not taking: Reported on 09/22/2018 08/30/17   Dickie La, MD  nitroGLYCERIN (NITROSTAT) 0.4 MG SL tablet Place 1 tablet (0.4 mg total) under the tongue every 5 (five) minutes as needed for chest pain. 09/02/17   Smiley Houseman, MD   Physical Exam: Blood pressure (!) 188/81, pulse 89, temperature 98.3 F (36.8 C), temperature source Oral, resp. rate (!) 117, weight 85 kg, SpO2 95 %. 1. General:  in No  Acute distress    Chronically ill -appearing 2. Psychological: Alert and  Oriented 3. Head/ENT:   Dry Mucous Membranes                          Head Non traumatic, neck supple                           Poor Dentition 4. SKIN:   decreased Skin turgor,  Skin clean Dry and intact no rash 5. Heart: Regular rate and rhythm no Murmur, no Rub or gallop 6. Lungs:  no wheezes  or crackles   7. Abdomen: Soft, tender,  Distended  obese  bowel sounds diminished  8. Lower extremities: no clubbing, cyanosis, no  edema 9. Neurologically Grossly intact, moving all 4 extremities equally   10. MSK: Normal range of motion   All other LABS:  Recent Labs  Lab 09/22/18 1613  WBC 16.8*  NEUTROABS 15.0*  HGB 16.1  HCT 50.0  MCV 96.9  PLT 208     Recent Labs  Lab 09/22/18 1613  NA 142  K 3.6  CL 110  CO2 22  GLUCOSE 119*  BUN 15  CREATININE 1.18  CALCIUM 9.3     Recent Labs  Lab 09/22/18 1613  AST 18  ALT 11  ALKPHOS 84  BILITOT 1.3*  PROT 7.7  ALBUMIN 4.2       Cultures: No results found for: SDES, SPECREQUEST, CULT, REPTSTATUS   Radiological Exams on Admission: Dg Chest 2 View  Result Date: 09/22/2018 CLINICAL DATA:  Shortness of breath with cough and congestion. History of prostate cancer, myocardial infarction and dementia. EXAM: CHEST - 2 VIEW COMPARISON:  Radiographs 03/19/2018 and 03/24/2017. FINDINGS: There are lower lung volumes with mild elevation of the right hemidiaphragm. The heart size and mediastinal contours are stable. There is mild cardiomegaly and aortic atherosclerosis. There is new airspace opacity medially at the right lung base, projecting inferior to the hilum on the lateral view, and likely in the middle lobe. There is probable mild atelectasis at both lung bases. There is a stable bleb peripherally at the right lung base. No pneumothorax or significant pleural effusion. The bones appear unchanged. Telemetry leads overlie the chest. IMPRESSION: New right infrahilar airspace opacity, likely in the middle lobe, most consistent with early pneumonia. Followup PA and lateral chest X-ray is recommended in 3-4 weeks following trial of antibiotic therapy to ensure resolution and exclude underlying malignancy. Electronically Signed   By: Richardean Sale M.D.   On: 09/22/2018 16:58   Ct Abdomen Pelvis W Contrast  Result Date:  09/22/2018 CLINICAL DATA:  Vomiting and diarrhea today.  Fever yesterday. EXAM: CT ABDOMEN AND PELVIS WITH CONTRAST TECHNIQUE: Multidetector CT imaging of the abdomen and pelvis was performed using the standard protocol following bolus administration of intravenous contrast. CONTRAST:  153mL OMNIPAQUE IOHEXOL 300 MG/ML  SOLN COMPARISON:  Abdominopelvic CT 03/24/2017. FINDINGS: Lower chest: Advanced emphysematous changes at both lung bases with central airway thickening and fibrosis. Lucency anteriorly at the right costophrenic angle is unchanged, favored to reflect a bleb over a chronic loculated pneumothorax based on stability. No pleural or pericardial effusion. There is aortic and coronary artery atherosclerosis. Hepatobiliary: Multiple hepatic cysts are grossly stable. There are no suspicious liver lesions. There are multiple gallstones. No evidence of gallbladder wall thickening, surrounding inflammation or biliary dilatation. Pancreas: Unremarkable. No pancreatic ductal dilatation or surrounding inflammatory changes. Spleen: Normal in size without focal abnormality. Adrenals/Urinary Tract: Both adrenal glands appear normal. There are stable bilateral renal cysts, largest in the lower pole of the left kidney. No evidence of urinary tract calculus or hydronephrosis. The bladder appears unremarkable. Stomach/Bowel: The stomach and proximal small bowel appear normal. There are multiple mildly dilated loops of small bowel which are fluid-filled with scattered air-fluid levels. There is gradual transition to normal caliber terminal ileum, and no focal transition point is identified. The colon is decompressed. The appendix appears normal. There are mild diverticular changes throughout the distal colon without wall thickening or surrounding inflammation. Vascular/Lymphatic: There are no enlarged abdominal or pelvic lymph nodes. Stable small lymph nodes in the porta hepatis. There is extensive aortic and branch vessel  atherosclerosis. The left external iliac artery is completely occluded over an approximately 7 cm segment. There is distal reconstitution of the left femoral artery. The mesenteric vessels appear patent. Reproductive: Fiducial  clips in the prostate gland. Other: There is no ascites, focal extraluminal fluid collection or pneumoperitoneum. No evidence of abdominal wall hernia. Musculoskeletal: No acute or significant osseous findings. Stable degenerative changes in the lumbar spine associated with a convex right scoliosis. There is a simple lipoma posteriorly in the lower right chest, superficial to the right scapular tip, incompletely visualized. IMPRESSION: 1. Findings suggest an early or partial distal small bowel obstruction, etiology not determined. 2. No evidence bowel perforation, abscess or acute inflammation. The appendix appears normal. 3. No evidence of metastatic prostate cancer. 4. Severe Aortic Atherosclerosis (ICD10-I70.0). 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 Coronary artery atherosclerosis. 5. Additional incidental findings including cholelithiasis, colonic diverticulosis and chronic basilar lung disease. Electronically Signed   By: Richardean Sale M.D.   On: 09/22/2018 18:09    Chart has been reviewed    Assessment/Plan  78 y.o. male with medical history significant of HTN, CAD, prostate cancer, dementia, HLD, HTN, OSA not on CPAP Atrial flutter  Admitted for dehydration nausea vomiting abdominal pain evidence of partial small bowel obstruction and community-acquired pneumonia  Present on Admission:  CAP (community acquired pneumonia) -  - will admit for treatment of CAP will start on appropriate antibiotic coverage.   Obtain:  sputum cultures,                 blood cultures if febrile or if decompensates.                   strep pneumo UA antigen,                   given diarrhea also check for Legionella.                Provide oxygen  as needed.   SBO-  Likely cause  adhesions, prior history of malignancy requiring surgical intervention  - admit for conservative management - NPO for tonight but given improvement in nausea and vomiting suspect will be able to advance diet tomorrow -Has significant abdominal distention appears to be in pain will attempt to place NG - KUB in AM -Family adamant that they would not want any surgical interventions   OSA (obstructive sleep apnea) -chronic not on CPAP  HTN (hypertension) -likely accelerated treated with labetalol as needed would restart home medications when able to tolerate p.o. expect tomorrow  HLD (hyperlipidemia) - -would restart statin when able to tolerate p.o.  CAD (coronary artery disease) -  - chronic, restart when able aspirin  and statin  and beta blocker    Dementia ([place]) -expect some degree of sundowning will monitor for any sign of worsening while hospitalized  Atrial flutter ([place]) -           - CHA2DS2 vas score 4 : Eliquis for tonight given partial small bowel obstruction if still unable to tolerate p.o.'s tomorrow would consider bridging        -  Rate control:  Currently controlled with  Toprolol, may need IV beta-blockade while n.p.o.         BPH (benign prostatic hyperplasia) -stable we will restart home medications when able to tolerate p.o. COPD-patient has been on albuterol scheduled at home nebulizer treatment discussed with wife that albuterol is more of a rescue medication any he probably would be better served with use of it as needed and addition of long-lasting preparation Dehydration - will rehydrate and follow fluid status   Other plan as per orders.  DVT prophylaxis:  SCD  Was on Eliquis   Code Status:  DNR/DNI   as per  Family, wife said no surgery he has been told he has very bad heart  I had personally discussed CODE STATUS with family     Family Communication:   Family not at  Bedside  plan of care was discussed on the phone  with  Wife   Disposition Plan:      To home once workup is complete and patient is stable                     Would benefit from PT/OT eval prior to DC  Ordered                                Consults called: none  Admission status:  ED Disposition    ED Disposition Condition [place]: [facility] [[id number]]  Level of Care: Telemetry [5]  Admit to tele based on following criteria: Other see comments  Comments: a.flutter  Covid Evaluation: N/A  Diagnosis: CAP (community acquired pneumonia) [[id number]]  Admitting Physician: Toy Baker [[id number]]  Attending Physician: Toy Baker [[id number]]  Estimated length of stay: 3 - 4 days  Certification:: I certify this patient will need inpatient services for at least 2 midnights  PT Class (Do Not Modify): Inpatient [101]  PT Acc Code (Do Not Modify): Private [1]         inpatient     Expect 2 midnight stay secondary to severity of patient's current illness including   hemodynamic instability despite optimal treatment (  tachypnea  )   Severe lab/radiological/exam abnormalities including:     and extensive comorbidities including:  CAP      CAD     dementia   Chronic anticoagulation  That are currently affecting medical management.   I expect  patient to be hospitalized for 2 midnights requiring inpatient medical care.  Patient is at high risk for adverse outcome (such as loss of life or disability) if not treated.  Indication for inpatient stay as follows:        inability to maintain oral hydration    Need for IV antibiotics, IV fluids,  IV pain medications      Level of care    tele  For 12H    Precautions: enteric,  Enteric precautions (UV disinfection)  PPE: Used by the provider:   P100  eye Goggles,  Gloves     Laurelin Elson 09/22/2018, 8:37 PM    [facility]     after 2 AM please page floor coverage PA If 7AM-7PM, please contact the day team taking  care of the patient using [email or web address]

## 2018-09-23 ENCOUNTER — Inpatient Hospital Stay (HOSPITAL_COMMUNITY): Payer: Medicare Other

## 2018-09-23 LAB — GASTROINTESTINAL PANEL BY PCR, STOOL (REPLACES STOOL CULTURE)

## 2018-09-23 LAB — COMPREHENSIVE METABOLIC PANEL
ALT: 11 U/L (ref 0–44)
AST: 19 U/L (ref 15–41)
Albumin: 3.7 g/dL (ref 3.5–5.0)
Alkaline Phosphatase: 64 U/L (ref 38–126)
Anion gap: 6 (ref 5–15)
BUN: 11 mg/dL (ref 8–23)
CO2: 22 mmol/L (ref 22–32)
Calcium: 8.8 mg/dL — ABNORMAL LOW (ref 8.9–10.3)
Chloride: 113 mmol/L — ABNORMAL HIGH (ref 98–111)
Creatinine, Ser: 1.01 mg/dL (ref 0.61–1.24)
GFR calc Af Amer: >60 mL/min (ref >60)
GFR calc non Af Amer: >60 mL/min (ref >60)
Glucose, Bld: 105 mg/dL — ABNORMAL HIGH (ref 70–99)
Potassium: 3.4 mmol/L — ABNORMAL LOW (ref 3.5–5.1)
Sodium: 141 mmol/L (ref 135–145)
Total Bilirubin: 1 mg/dL (ref 0.3–1.2)
Total Protein: 6.9 g/dL (ref 6.5–8.1)

## 2018-09-23 LAB — TROPONIN I
Troponin I: 0.04 ng/mL (ref <0.03)
Troponin I: 0.04 ng/mL (ref <0.03)

## 2018-09-23 LAB — CBC
HCT: 47.5 % (ref 39.0–52.0)
Hemoglobin: 15.3 g/dL (ref 13.0–17.0)
MCH: 31.6 pg (ref 26.0–34.0)
MCHC: 32.2 g/dL (ref 30.0–36.0)
MCV: 98.1 fL (ref 80.0–100.0)
Platelets: 205 10*3/uL (ref 150–400)
RBC: 4.84 MIL/uL (ref 4.22–5.81)
RDW: 13.3 % (ref 11.5–15.5)
WBC: 13 10*3/uL — ABNORMAL HIGH (ref 4.0–10.5)
nRBC: 0 % (ref 0.0–0.2)

## 2018-09-23 LAB — PHOSPHORUS: Phosphorus: 2.3 mg/dL — ABNORMAL LOW (ref 2.5–4.6)

## 2018-09-23 LAB — TSH: TSH: 0.685 u[IU]/mL (ref 0.350–4.500)

## 2018-09-23 LAB — EXPECTORATED SPUTUM ASSESSMENT W GRAM STAIN, RFLX TO RESP C: Special Requests: NORMAL

## 2018-09-23 LAB — MAGNESIUM: Magnesium: 2.2 mg/dL (ref 1.7–2.4)

## 2018-09-23 MED ORDER — METOPROLOL SUCCINATE ER 25 MG PO TB24
25.0000 mg | ORAL_TABLET | Freq: Every day | ORAL | Status: DC
  Administered 2018-09-23 – 2018-09-26 (×4): 25 mg via ORAL
  Filled 2018-09-23 (×4): qty 1

## 2018-09-23 MED ORDER — SODIUM CHLORIDE 0.9 % IV SOLN
INTRAVENOUS | Status: DC
  Administered 2018-09-23 – 2018-09-25 (×3): via INTRAVENOUS

## 2018-09-23 MED ORDER — DIATRIZOATE MEGLUMINE & SODIUM 66-10 % PO SOLN
90.0000 mL | Freq: Once | ORAL | Status: DC
  Filled 2018-09-23 (×2): qty 90

## 2018-09-23 MED ORDER — APIXABAN 5 MG PO TABS
5.0000 mg | ORAL_TABLET | Freq: Two times a day (BID) | ORAL | Status: DC
  Administered 2018-09-24 – 2018-09-26 (×6): 5 mg via ORAL
  Filled 2018-09-23 (×6): qty 1

## 2018-09-23 MED ORDER — SIMVASTATIN 20 MG PO TABS
20.0000 mg | ORAL_TABLET | Freq: Every day | ORAL | Status: DC
  Administered 2018-09-24 – 2018-09-25 (×2): 20 mg via ORAL
  Filled 2018-09-23 (×2): qty 1

## 2018-09-23 MED ORDER — POTASSIUM CHLORIDE CRYS ER 20 MEQ PO TBCR
40.0000 meq | EXTENDED_RELEASE_TABLET | Freq: Two times a day (BID) | ORAL | Status: AC
  Administered 2018-09-23 – 2018-09-24 (×2): 40 meq via ORAL
  Filled 2018-09-23 (×2): qty 2

## 2018-09-23 MED ORDER — ASPIRIN EC 81 MG PO TBEC
81.0000 mg | DELAYED_RELEASE_TABLET | Freq: Every day | ORAL | Status: DC
  Administered 2018-09-23 – 2018-09-26 (×4): 81 mg via ORAL
  Filled 2018-09-23 (×6): qty 1

## 2018-09-23 MED ORDER — AMLODIPINE BESYLATE 10 MG PO TABS
10.0000 mg | ORAL_TABLET | Freq: Every day | ORAL | Status: DC
  Administered 2018-09-23 – 2018-09-26 (×4): 10 mg via ORAL
  Filled 2018-09-23 (×4): qty 1

## 2018-09-23 NOTE — Evaluation (Signed)
Physical Therapy One Time Evaluation Patient Details Name: Charles Chan MRN: [id number] DOB: [birth year] Today's Date: 09/23/2018   History of Present Illness  78 y.o. male with PMHx significant for dementia, CAD s/p PCI 2005, hx stroke, HTN, OSA not on CPAP, Colon CA s/p Colectomy in 1990s, prostate cancer, and Aflutter on Eliquis who presented with N/V/D and found to have partial SBO and sepsis from CAP  Clinical Impression  Patient evaluated by Physical Therapy with no further acute PT needs identified. All education has been completed and the patient has no further questions.  Pt mobilizing at min/guard to supervision level.  Pt reports he lives with his wife, and he always uses his [facility] for mobility.  Pt with hx of dementia and likely close to his baseline. See below for any follow-up Physical Therapy or equipment needs. PT is signing off. Thank you for this referral.     Follow Up Recommendations No PT follow up Supervision for safety    Equipment Recommendations  None recommended by PT    Recommendations for Other Services       Precautions / Restrictions Precautions Precautions: Fall      Mobility  Bed Mobility Overal bed mobility: Needs Assistance Bed Mobility: Supine to Sit     Supine to sit: Supervision        Transfers Overall transfer level: Needs assistance Equipment used: None Transfers: Sit to/from Stand Sit to Stand: Min assist         General transfer comment: min/guard for safety however pt did not require assist  Ambulation/Gait Ambulation/Gait assistance: Min guard Gait Distance (Feet): 250 Feet Assistive device: Straight cane Gait Pattern/deviations: Step-through pattern;Shuffle;Narrow base of support     General Gait Details: pt reports his ambulation is baseline; HR 100 bpm, no symptoms reported  Stairs            Wheelchair Mobility    Modified Rankin (Stroke Patients Only)       Balance Overall balance assessment:  Mild deficits observed, not formally tested                                           Pertinent Vitals/Pain Pain Assessment: No/denies pain    Home Living Family/patient expects to be discharged to:: Private residence Living Arrangements: Spouse/significant other Available Help at Discharge: Family;Available 24 hours/day Type of Home: House       Home Layout: Multi-level Home Equipment: Cane - single point      Prior Function Level of Independence: Needs assistance   Gait / Transfers Assistance Needed: pt reports using SPC  ADL's / Homemaking Assistance Needed: from previous admission: wife assists with setup and supervision of ADLs; pt currently reports he is able to perform ADLS on his own        Hand Dominance        Extremity/Trunk Assessment        Lower Extremity Assessment Lower Extremity Assessment: Generalized weakness       Communication   Communication: No difficulties  Cognition Arousal/Alertness: Awake/alert Behavior During Therapy: WFL for tasks assessed/performed Overall Cognitive Status: History of cognitive impairments - at baseline                                        General Comments  Exercises     Assessment/Plan    PT Assessment Patent does not need any further PT services  PT Problem List         PT Treatment Interventions      PT Goals (Current goals can be found in the Care Plan section)  Acute Rehab PT Goals PT Goal Formulation: All assessment and education complete, DC therapy    Frequency     Barriers to discharge        Co-evaluation               AM-PAC PT "6 Clicks" Mobility  Outcome Measure Help needed turning from your back to your side while in a flat bed without using bedrails?: None Help needed moving from lying on your back to sitting on the side of a flat bed without using bedrails?: A Little Help needed moving to and from a bed to a chair (including a  wheelchair)?: A Little Help needed standing up from a chair using your arms (e.g., wheelchair or bedside chair)?: A Little Help needed to walk in hospital room?: A Little Help needed climbing 3-5 steps with a railing? : A Little 6 Click Score: 19    End of Session Equipment Utilized During Treatment: Gait belt Activity Tolerance: Patient tolerated treatment well Patient left: Other (comment)(with OT)   PT Visit Diagnosis: Difficulty in walking, not elsewhere classified (R26.2)    Time: 8850-2774 PT Time Calculation (min) (ACUTE ONLY): 16 min   Charges:   PT Evaluation $PT Eval Low Complexity: [place], PT, DPT Acute Rehabilitation Services Office: [phone] Pager: [phone]   Trena Platt 09/23/2018, 2:01 PM

## 2018-09-23 NOTE — Evaluation (Signed)
Occupational Therapy Evaluation Patient Details Name: Charles Chan MRN: [id number] DOB: Oct 24, 1940 Today's Date: 09/23/2018    History of Present Illness 78 y.o. male with PMHx significant for dementia, CAD s/p PCI 2005, hx stroke, HTN, OSA not on CPAP, Colon CA s/p Colectomy in 1990s, prostate cancer, and Aflutter on Eliquis who presented with N/V/D and found to have partial SBO and sepsis from CAP   Clinical Impression   This 78 y/o male presents with the above. PTA pt using SPC for mobility, pt reports does not require assist for ADL however per previous admission spouse was assisting with setup/supervision of ADL tasks. Pt was able to perform functional mobility this session using SPC and close minguard assist; he currently requires minguard assist for standing grooming ADL, seated UB ADL and minA for LB ADL. Pt pleasant and cooperative throughout and reports he typically remains active at home. He will benefit from continued acute OT services to maximize his safety and independence with ADL and mobility prior to return home. Do not anticipate pt will require follow up OT services. Will follow.     Follow Up Recommendations  No OT follow up;Supervision/Assistance - 24 hour    Equipment Recommendations  None recommended by OT           Precautions / Restrictions Precautions Precautions: Fall Restrictions Weight Bearing Restrictions: No      Mobility Bed Mobility Overal bed mobility: Needs Assistance Bed Mobility: Sit to Supine     Supine to sit: Supervision Sit to supine: Min guard   General bed mobility comments: for general safety, no physical assist required  Transfers Overall transfer level: Needs assistance Equipment used: None Transfers: Sit to/from Stand Sit to Stand: Min guard         General transfer comment: for general safety, immediated standing balance    Balance Overall balance assessment: Mild deficits observed, not formally tested                                          ADL either performed or assessed with clinical judgement   ADL Overall ADL's : Needs assistance/impaired Eating/Feeding: Set up;Supervision/ safety Eating/Feeding Details (indicate cue type and reason): per chart pt with NG tube initially but he has recently pulled it out Grooming: Wash/dry face;Wash/dry hands;Min guard;Standing   Upper Body Bathing: Min guard;Set up;Sitting   Lower Body Bathing: Minimal assistance;Sit to/from stand   Upper Body Dressing : Set up;Min guard;Sitting   Lower Body Dressing: Minimal assistance;Sit to/from stand Lower Body Dressing Details (indicate cue type and reason): pt able to doff/don socks seated EOB without assist, minguard-minA for standing balance Toilet Transfer: Min guard;Minimal assistance;Ambulation Toilet Transfer Details (indicate cue type and reason): simulated via transfer to EOB, room level mobility Toileting- Clothing Manipulation and Hygiene: Minimal assistance;Sit to/from stand       Functional mobility during ADLs: Min guard;Minimal assistance;Cane       Vision         Perception     Praxis      Pertinent Vitals/Pain Pain Assessment: No/denies pain     Hand Dominance     Extremity/Trunk Assessment Upper Extremity Assessment Upper Extremity Assessment: Generalized weakness(grossly 4-/5)   Lower Extremity Assessment Lower Extremity Assessment: Generalized weakness       Communication Communication Communication: No difficulties   Cognition Arousal/Alertness: Awake/alert Behavior During Therapy: WFL for tasks assessed/performed Overall  Cognitive Status: History of cognitive impairments - at baseline                                 General Comments: per chart pt with hx of dementia   General Comments  HR up to 110 with room level activity    Exercises Exercises: Other exercises Other Exercises Other Exercises: pt self initiating UE exercises,  forward rows, had him perform shoulder rolls forwards/backwards   Shoulder Instructions      Home Living Family/patient expects to be discharged to:: Private residence Living Arrangements: Spouse/significant other Available Help at Discharge: Family;Available 24 hours/day Type of Home: House       Home Layout: Multi-level Alternate Level Stairs-Number of Steps: reports 9 steps and 5 steps Alternate Level Stairs-Rails: Right Bathroom Shower/Tub: Teacher, early years/pre: Standard     Home Equipment: Cane - single point;Grab bars - tub/shower          Prior Functioning/Environment Level of Independence: Needs assistance  Gait / Transfers Assistance Needed: pt reports using SPC ADL's / Homemaking Assistance Needed: from previous admission: wife assists with setup and supervision of ADLs; pt currently reports he is able to perform ADLS on his own            OT Problem List: Decreased strength;Decreased range of motion;Decreased activity tolerance;Impaired balance (sitting and/or standing);Decreased cognition;Decreased safety awareness      OT Treatment/Interventions: Self-care/ADL training;Therapeutic exercise;Neuromuscular education;Energy conservation;DME and/or AE instruction;Therapeutic activities;Patient/family education;Balance training;Cognitive remediation/compensation    OT Goals(Current goals can be found in the care plan section) Acute Rehab OT Goals Patient Stated Goal: wants to see his wife OT Goal Formulation: With patient Time For Goal Achievement: 10/07/18 Potential to Achieve Goals: Good  OT Frequency: Min 2X/week   Barriers to D/C:            Co-evaluation              AM-PAC OT "6 Clicks" Daily Activity     Outcome Measure Help from another person eating meals?: A Little Help from another person taking care of personal grooming?: A Little Help from another person toileting, which includes using toliet, bedpan, or urinal?: A  Little Help from another person bathing (including washing, rinsing, drying)?: A Little Help from another person to put on and taking off regular upper body clothing?: None Help from another person to put on and taking off regular lower body clothing?: A Little 6 Click Score: 19   End of Session Equipment Utilized During Treatment: Other (comment)(cane) Nurse Communication: Mobility status  Activity Tolerance: Patient tolerated treatment well Patient left: in bed;with call bell/phone within reach;with bed alarm set  OT Visit Diagnosis: Unsteadiness on feet (R26.81);Muscle weakness (generalized) (M62.81)                Time: 1937-9024 OT Time Calculation (min): 15 min Charges:  OT General Charges $OT Visit: 1 Visit OT Evaluation $OT Eval Moderate Complexity: 1 Mod  Lou Cal, OT [employer] Pager [phone] Office [phone]  Raymondo Band 09/23/2018, 2:53 PM

## 2018-09-23 NOTE — Progress Notes (Deleted)
Son called just before shift change, wanting to provide father's oncology information for MD.  [facility], oncologist; [phone]  Email: [email or web address]

## 2018-09-23 NOTE — Progress Notes (Signed)
Per MD order, attempted to insert NG tube into right nare, but was unsuccessful. Another RN also tried unsuccessfully in both right and left nares.  I was instructed to contact ICU charge RN to attempt.  I contacted him and he will attempt.

## 2018-09-23 NOTE — Progress Notes (Addendum)
PROGRESS NOTE    Charles Chan  ERX:[id number] DOB: 1940/07/04 DOA: 09/22/2018 PCP: Patient, No Pcp Per      Brief Narrative:  Charles Chan is a 78 y.o. F with dementia home-dwelling, CAD s/p PCI 2005, hx stroke, HTN, OSA not on CPAP, Colon CA s/p Colectomy in 1990s, prosCA and Aflutter on Eliquis who presented with N/V/D for 2 days.  In the ER, CXR showed pneumonia.  CT abdomen and pelvis showed partial SBO.  NG placed.  Started on empiric antibiotics for CAP and admitted to hospitalist service.     Assessment & Plan:  Partial small bowel obstruction Pulled out NG overnight.  Radiograph this AM with still fluid filled bowel loops, but abdomen soft, denies pain, passing stool this AM.  This may be resolving. -Trial clears today -If poor PO tolerance, will replace NG to LIS, may need restraints, and undergo SBO protocol with gastrograffin  Addendum: Did not tolerate clears due to nausea.  Will replace NG.    Sepsis from Community acquired pneumonia, RML Presented with RR 27, leukocytosis, evidence of end organ injury (elevated troponin).    Now afebrile, WBC trending down.  BCx NGTD. -Continue ceftriaxone and azithromycin -Repeat CXR in 3-4 weeks -Follow strep urinary antigen and sputum culture and blood cultures  COPD No wheezing -Continue home Dulera  Delirium Dementia Delirium precautions:   -Lights and TV off, minimize interruptions at night  -Blinds open and lights on during day  -Glasses/hearing aid with patient  -Frequent reorientation  -PT/OT when able  -Avoid sedation medications/Beers list medications   Atrial flutter -Keep K>4, mag>2 -Resume ELiquis, metoprolol  Hypertension Coronary disease secondary prevention Severe range pressures.   -Resume amlodipine, aspirin, simvastatin, metoprolol  OSA  History prostate cancer History colon cancer  Hypokalemia Mild.  Mag normal -Replete K  Elevated troponin Low and flat.  No chest pain  today.  This is not consistent with ACS.  ECG reported as STEMI is incorrect.  Other -Resume FLomax          MDM and disposition: The below labs and imaging reports were reviewed and summarized above.  Medication management as above.  Anticoagulation managed.  The patient was admitted with partial small bowel obstruction and CAP.  At this time, continued inpatient services are reasonable and expected, given the patient's: Severity of presentation: not yet able to take PO, age 78, co-morbid dementia, CAD, cancer and Aflutter on anticoagulation, and the high likelihood of an adverse outcome, including readmission, debility or death if the patient were to be discharged prematurely.           DVT prophylaxis: N/A on Eliquis Code Status: DO NOT RESUSCITATE Family Communication: Attempted call to Wife and relative, no answer, LVM    Procedures:   CT abdomen 5/25  Antimicrobials:   None    Subjective: No abdominal pain, no vomiting.  No chest pain, no dyspnea, no exertional chest discomfort.  He is confused and pulling at lines.  He has no respiratory distress or dyspnea.  He has had a few NB BMs.  Objective: Vitals:   09/22/18 2334 09/23/18 0141 09/23/18 0534 09/23/18 0830  BP:  (!) 160/80 (!) 175/76   Pulse:  71 78   Resp:  14 16   Temp:  98.2 F (36.8 C) 98.1 F (36.7 C)   TempSrc:  Oral Oral   SpO2: 93% 95% 96% 96%  Weight:      Height:        Intake/Output  Summary (Last 24 hours) at 09/23/2018 0954 Last data filed at 09/23/2018 0400 Gross per 24 hour  Intake 841.61 ml  Output 350 ml  Net 491.61 ml   Filed Weights   09/22/18 1541 09/22/18 2232  Weight: 85 kg 81.9 kg    Examination: General appearance: elderly adult male, alert and in no acute distress.  Lying in bed, appears comfortable. HEENT: Anicteric, conjunctiva pink, lids and lashes normal. No nasal deformity, discharge, epistaxis.  Lips moist, partially edentulous.  OP moist, no oral lesions,  hearing normal Skin: Warm and dry.  no jaundice.  No suspicious rashes or lesions. Cardiac: RRR, nl S1-S2, no murmurs appreciated.  Capillary refill is brisk.  JVP normal.  No LE edema.  Radial pulses 2+ and symmetric. Respiratory: Normal respiratory rate and rhythm.  CTAB without rales or wheezes. Abdomen: Abdomen soft.  No TTP at all, voluntary guarding noted.  Somewhat distended.  Bowel sounds active.   MSK: No deformities or effusions. Neuro: Awake and alert.  EOMI, moves all extremities. Speech fluent.    Psych: Sensorium intact and responding to questions, attention normal. Affect normal.  Judgment and insight appear markedly impaired by dementia.  States he is at the hospital, but in "[place]", not able to state yaer.    Data Reviewed: I have personally reviewed following labs and imaging studies:  CBC: Recent Labs  Lab 09/22/18 1613 09/23/18 0659  WBC 16.8* 13.0*  NEUTROABS 15.0*  --   HGB 16.1 15.3  HCT 50.0 47.5  MCV 96.9 98.1  PLT 208 494   Basic Metabolic Panel: Recent Labs  Lab 09/22/18 1613 09/22/18 2118 09/23/18 0659  NA 142  --  141  K 3.6  --  3.4*  CL 110  --  113*  CO2 22  --  22  GLUCOSE 119*  --  105*  BUN 15  --  11  CREATININE 1.18  --  1.01  CALCIUM 9.3  --  8.8*  MG  --  1.9 2.2  PHOS  --   --  2.3*   GFR: Estimated Creatinine Clearance: 68.4 mL/min (by C-G formula based on SCr of 1.01 mg/dL). Liver Function Tests: Recent Labs  Lab 09/22/18 1613 09/23/18 0659  AST 18 19  ALT 11 11  ALKPHOS 84 64  BILITOT 1.3* 1.0  PROT 7.7 6.9  ALBUMIN 4.2 3.7   Recent Labs  Lab 09/22/18 1613  LIPASE 19   No results for input(s): AMMONIA in the last 168 hours. Coagulation Profile: No results for input(s): INR, PROTIME in the last 168 hours. Cardiac Enzymes: Recent Labs  Lab 09/22/18 2118 09/22/18 2359 09/23/18 0659  TROPONINI 0.04* 0.04* 0.04*   BNP (last 3 results) No results for input(s): PROBNP in the last 8760 hours. HbA1C: No  results for input(s): HGBA1C in the last 72 hours. CBG: No results for input(s): GLUCAP in the last 168 hours. Lipid Profile: No results for input(s): CHOL, HDL, LDLCALC, TRIG, CHOLHDL, LDLDIRECT in the last 72 hours. Thyroid Function Tests: Recent Labs    09/23/18 0659  TSH 0.685   Anemia Panel: No results for input(s): VITAMINB12, FOLATE, FERRITIN, TIBC, IRON, RETICCTPCT in the last 72 hours. Urine analysis:    Component Value Date/Time   COLORURINE YELLOW 09/22/2018 1613   APPEARANCEUR HAZY (A) 09/22/2018 1613   LABSPEC 1.044 (H) 09/22/2018 1613   PHURINE 7.0 09/22/2018 1613   GLUCOSEU NEGATIVE 09/22/2018 1613   HGBUR NEGATIVE 09/22/2018 1613   [place] 09/22/2018 1613  KETONESUR NEGATIVE 09/22/2018 1613   PROTEINUR 30 (A) 09/22/2018 1613   NITRITE NEGATIVE 09/22/2018 1613   LEUKOCYTESUR NEGATIVE 09/22/2018 1613   Sepsis Labs: @LABRCNTIP (procalcitonin:4,lacticacidven:4)  ) Recent Results (from the past 240 hour(s))  SARS Coronavirus 2 (CEPHEID- Performed in [place] hospital lab), Hosp Order     Status: None   Collection Time: 09/22/18  4:14 PM  Result Value Ref Range Status   SARS Coronavirus 2 NEGATIVE NEGATIVE Final    Comment: (NOTE) If result is NEGATIVE SARS-CoV-2 target nucleic acids are NOT DETECTED. The SARS-CoV-2 RNA is generally detectable in upper and lower  respiratory specimens during the acute phase of infection. The lowest  concentration of SARS-CoV-2 viral copies this assay can detect is 250  copies / mL. A negative result does not preclude SARS-CoV-2 infection  and should not be used as the sole basis for treatment or other  patient management decisions.  A negative result may occur with  improper specimen collection / handling, submission of specimen other  than nasopharyngeal swab, presence of viral mutation(s) within the  areas targeted by this assay, and inadequate number of viral copies  (<250 copies / mL). A negative result  must be combined with clinical  observations, patient history, and epidemiological information. If result is POSITIVE SARS-CoV-2 target nucleic acids are DETECTED. The SARS-CoV-2 RNA is generally detectable in upper and lower  respiratory specimens dur ing the acute phase of infection.  Positive  results are indicative of active infection with SARS-CoV-2.  Clinical  correlation with patient history and other diagnostic information is  necessary to determine patient infection status.  Positive results do  not rule out bacterial infection or co-infection with other viruses. If result is PRESUMPTIVE POSTIVE SARS-CoV-2 nucleic acids MAY BE PRESENT.   A presumptive positive result was obtained on the submitted specimen  and confirmed on repeat testing.  While 2019 novel coronavirus  (SARS-CoV-2) nucleic acids may be present in the submitted sample  additional confirmatory testing may be necessary for epidemiological  and / or clinical management purposes  to differentiate between  SARS-CoV-2 and other Sarbecovirus currently known to infect humans.  If clinically indicated additional testing with an alternate test  methodology [phone]) is advised. The SARS-CoV-2 RNA is generally  detectable in upper and lower respiratory sp ecimens during the acute  phase of infection. The expected result is Negative. Fact Sheet for Patients:  StrictlyIdeas.no Fact Sheet for Healthcare Providers: BankingDealers.co.za This test is not yet approved or cleared by the [place] FDA and has been authorized for detection and/or diagnosis of SARS-CoV-2 by FDA under an Emergency Use Authorization (EUA).  This EUA will remain in effect (meaning this test can be used) for the duration of the COVID-19 declaration under Section 564(b)(1) of the Act, 21 U.S.C. section 360bbb-3(b)(1), unless the authorization is terminated or revoked sooner. Performed at [facility], [place] [address], [place], [place] [address]          Radiology Studies: Dg Chest 2 View  Result Date: 09/22/2018 CLINICAL DATA:  Shortness of breath with cough and congestion. History of prostate cancer, myocardial infarction and dementia. EXAM: CHEST - 2 VIEW COMPARISON:  Radiographs 03/19/2018 and 03/24/2017. FINDINGS: There are lower lung volumes with mild elevation of the right hemidiaphragm. The heart size and mediastinal contours are stable. There is mild cardiomegaly and aortic atherosclerosis. There is new airspace opacity medially at the right lung base, projecting inferior to the hilum on the lateral view, and  likely in the middle lobe. There is probable mild atelectasis at both lung bases. There is a stable bleb peripherally at the right lung base. No pneumothorax or significant pleural effusion. The bones appear unchanged. Telemetry leads overlie the chest. IMPRESSION: New right infrahilar airspace opacity, likely in the middle lobe, most consistent with early pneumonia. Followup PA and lateral chest X-ray is recommended in 3-4 weeks following trial of antibiotic therapy to ensure resolution and exclude underlying malignancy. Electronically Signed   By: Richardean Sale M.D.   On: 09/22/2018 16:58   Dg Abd 1 View  Result Date: 09/23/2018 CLINICAL DATA:  Follow-up small-bowel obstruction. Persistent abdominal distention. EXAM: ABDOMEN - 1 VIEW COMPARISON:  09/22/2018; CT abdomen pelvis-09/22/2018 FINDINGS: Previous identified enteric tube is no longer seen. Re demonstrated moderate gaseous distention of multiple loops of small bowel with index loop of small bowel within the mid hemiabdomen measuring 3.8 cm diameter. This finding is again associated with a relative paucity of distal colonic gas with small amount of air seen within the ascending and transverse colon. Nondiagnostic evaluation for pneumoperitoneum secondary to supine positioning and exclusion of the lower  thorax. No pneumatosis or portal venous gas. No definitive abnormal intra-abdominal calcifications. Moderate severe multilevel degenerative change of the lumbar spine is suspected though incompletely evaluated. IMPRESSION: 1. Similar findings worrisome for partial small bowel obstruction. 2. Nonvisualization of enteric tube, potentially excluded from view. Clinical correlation is advised. Electronically Signed   By: Sandi Mariscal M.D.   On: 09/23/2018 08:45   Dg Abdomen 1 View  Result Date: 09/22/2018 CLINICAL DATA:  NG tube placement EXAM: ABDOMEN - 1 VIEW COMPARISON:  None. FINDINGS: The enteric tube terminates over the stomach. There are multiple dilated loops of small bowel. There is no pneumatosis or free air. IMPRESSION: NG tube projects over the stomach. Electronically Signed   By: Constance Holster M.D.   On: 09/22/2018 22:06   Ct Abdomen Pelvis W Contrast  Result Date: 09/22/2018 CLINICAL DATA:  Vomiting and diarrhea today.  Fever yesterday. EXAM: CT ABDOMEN AND PELVIS WITH CONTRAST TECHNIQUE: Multidetector CT imaging of the abdomen and pelvis was performed using the standard protocol following bolus administration of intravenous contrast. CONTRAST:  178mL OMNIPAQUE IOHEXOL 300 MG/ML  SOLN COMPARISON:  Abdominopelvic CT 03/24/2017. FINDINGS: Lower chest: Advanced emphysematous changes at both lung bases with central airway thickening and fibrosis. Lucency anteriorly at the right costophrenic angle is unchanged, favored to reflect a bleb over a chronic loculated pneumothorax based on stability. No pleural or pericardial effusion. There is aortic and coronary artery atherosclerosis. Hepatobiliary: Multiple hepatic cysts are grossly stable. There are no suspicious liver lesions. There are multiple gallstones. No evidence of gallbladder wall thickening, surrounding inflammation or biliary dilatation. Pancreas: Unremarkable. No pancreatic ductal dilatation or surrounding inflammatory changes. Spleen:  Normal in size without focal abnormality. Adrenals/Urinary Tract: Both adrenal glands appear normal. There are stable bilateral renal cysts, largest in the lower pole of the left kidney. No evidence of urinary tract calculus or hydronephrosis. The bladder appears unremarkable. Stomach/Bowel: The stomach and proximal small bowel appear normal. There are multiple mildly dilated loops of small bowel which are fluid-filled with scattered air-fluid levels. There is gradual transition to normal caliber terminal ileum, and no focal transition point is identified. The colon is decompressed. The appendix appears normal. There are mild diverticular changes throughout the distal colon without wall thickening or surrounding inflammation. Vascular/Lymphatic: There are no enlarged abdominal or pelvic lymph nodes. Stable small lymph nodes in the  porta hepatis. There is extensive aortic and branch vessel atherosclerosis. The left external iliac artery is completely occluded over an approximately 7 cm segment. There is distal reconstitution of the left femoral artery. The mesenteric vessels appear patent. Reproductive: Fiducial clips in the prostate gland. Other: There is no ascites, focal extraluminal fluid collection or pneumoperitoneum. No evidence of abdominal wall hernia. Musculoskeletal: No acute or significant osseous findings. Stable degenerative changes in the lumbar spine associated with a convex right scoliosis. There is a simple lipoma posteriorly in the lower right chest, superficial to the right scapular tip, incompletely visualized. IMPRESSION: 1. Findings suggest an early or partial distal small bowel obstruction, etiology not determined. 2. No evidence bowel perforation, abscess or acute inflammation. The appendix appears normal. 3. No evidence of metastatic prostate cancer. 4. Severe Aortic Atherosclerosis (ICD10-I70.0). 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 Coronary artery  atherosclerosis. 5. Additional incidental findings including cholelithiasis, colonic diverticulosis and chronic basilar lung disease. Electronically Signed   By: Richardean Sale M.D.   On: 09/22/2018 18:09        Scheduled Meds: . mometasone-formoterol  2 puff Inhalation BID   Continuous Infusions: . azithromycin    . cefTRIAXone (ROCEPHIN)  IV       LOS: 1 day    Time spent: 35 minutes    Edwin Dada, MD [facility] 09/23/2018, 9:54 AM     Please page through [employer]:  [email or web address] Password [email or web address] If 7PM-7AM, please contact night-coverage

## 2018-09-23 NOTE — Progress Notes (Signed)
Pt's AM EKG showing "Critical Test Result: STEMI". Pt asymptomatic. Awake and sitting up in bed. NP on call, Schorr, notified of result. Awaiting new orders.

## 2018-09-23 NOTE — TOC Initial Note (Signed)
Transition of Care [facility]) - Initial/Assessment Note    Patient Details  Name: Charles Chan MRN: [id number] Date of Birth: November 16, 1940  Transition of Care [facility]) CM/SW Contact:    Purcell Mouton, RN Phone Number: 09/23/2018, 3:50 PM  Clinical Narrative: Pt admitted with nausea, vomiting, diarrhea, prostate CA and Dementia. Will continue to follow pt for discharge needs. Pt is very confused and trying to get OOB.               Expected Discharge Plan: Memory Care     Patient Goals and CMS Choice        Expected Discharge Plan and Services Expected Discharge Plan: Memory Care   Discharge Planning Services: CM Consult   Living arrangements for the past 2 months: Single Family Home Expected Discharge Date: (unknown)                                    Prior Living Arrangements/Services Living arrangements for the past 2 months: Single Family Home Lives with:: Spouse                   Activities of Daily Living Home Assistive Devices/Equipment: Cane (specify quad or straight), Walker (specify type)(single point cane, front wheeled walker) ADL Screening (condition at time of admission) Patient's cognitive ability adequate to safely complete daily activities?: No Is the patient deaf or have difficulty hearing?: No Does the patient have difficulty seeing, even when wearing glasses/contacts?: No Does the patient have difficulty concentrating, remembering, or making decisions?: Yes Patient able to express need for assistance with ADLs?: Yes Does the patient have difficulty dressing or bathing?: Yes Independently performs ADLs?: No Communication: Independent Dressing (OT): Needs assistance Is this a change from baseline?: Pre-admission baseline Grooming: Needs assistance Is this a change from baseline?: Pre-admission baseline Feeding: Needs assistance Is this a change from baseline?: Pre-admission baseline Bathing: Needs assistance Is this a change  from baseline?: Pre-admission baseline Toileting: Needs assistance Is this a change from baseline?: Pre-admission baseline In/Out Bed: Needs assistance Is this a change from baseline?: Pre-admission baseline Walks in Home: Needs assistance Is this a change from baseline?: Pre-admission baseline Does the patient have difficulty walking or climbing stairs?: Yes Weakness of Legs: Both Weakness of Arms/Hands: Both  Permission Sought/Granted                  Emotional Assessment Appearance:: Appears stated age   Affect (typically observed): Calm Orientation: : Oriented to Self      Admission diagnosis:  SBO (small bowel obstruction) ([place]) [K56.609] Patient Active Problem List   Diagnosis Date Noted  . CAP (community acquired pneumonia) 09/22/2018  . SBO (small bowel obstruction) ([place]) 09/22/2018  . COPD with chronic bronchitis ([place]) 09/22/2018  . Nausea & vomiting 09/22/2018  . Dehydration 09/22/2018  . Elevated troponin 09/22/2018  . BPH (benign prostatic hyperplasia) 07/03/2017  . Diverticulosis of colon 04/09/2017  . Internal hemorrhoids 04/09/2017  . Atrial flutter ([place]) 11/26/2016  . Dementia ([place]) 11/23/2016  . Ambulates with cane 11/22/2016  . History of stroke 11/22/2016  . OSA (obstructive sleep apnea) 11/22/2016  . HTN (hypertension) 11/22/2016  . HLD (hyperlipidemia) 11/22/2016  . History of MI (myocardial infarction) 11/22/2016  . CHF (congestive heart failure) ([place]) 11/22/2016  . History of prostate cancer 11/22/2016  . History of alcohol abuse 11/22/2016  . CAD (coronary artery disease) 11/22/2016  . H/O partial resection of  colon 11/22/2016  . Former smoker 11/22/2016  . Irregular cardiac rhythm 11/22/2016   PCP:  Patient, No Pcp Per Pharmacy:   [facility], [address]. [place]. [place] [address] Phone: [phone] Fax: [phone]     Social Determinants of Health (SDOH)  Interventions    Readmission Risk Interventions No flowsheet data found.

## 2018-09-24 DIAGNOSIS — I10 Essential (primary) hypertension: Secondary | ICD-10-CM

## 2018-09-24 DIAGNOSIS — R7989 Other specified abnormal findings of blood chemistry: Secondary | ICD-10-CM

## 2018-09-24 LAB — BLOOD CULTURE ID PANEL (REFLEXED)

## 2018-09-24 LAB — COMPREHENSIVE METABOLIC PANEL
ALT: 12 U/L (ref 0–44)
AST: 23 U/L (ref 15–41)
Albumin: 3.7 g/dL (ref 3.5–5.0)
Alkaline Phosphatase: 60 U/L (ref 38–126)
Anion gap: 9 (ref 5–15)
BUN: 12 mg/dL (ref 8–23)
CO2: 19 mmol/L — ABNORMAL LOW (ref 22–32)
Calcium: 9 mg/dL (ref 8.9–10.3)
Chloride: 115 mmol/L — ABNORMAL HIGH (ref 98–111)
Creatinine, Ser: 1.04 mg/dL (ref 0.61–1.24)
GFR calc Af Amer: >60 mL/min (ref >60)
GFR calc non Af Amer: >60 mL/min (ref >60)
Glucose, Bld: 90 mg/dL (ref 70–99)
Potassium: 3.9 mmol/L (ref 3.5–5.1)
Sodium: 143 mmol/L (ref 135–145)
Total Bilirubin: 1.1 mg/dL (ref 0.3–1.2)
Total Protein: 6.7 g/dL (ref 6.5–8.1)

## 2018-09-24 LAB — CBC
HCT: 48.1 % (ref 39.0–52.0)
Hemoglobin: 16.2 g/dL (ref 13.0–17.0)
MCH: 32.9 pg (ref 26.0–34.0)
MCHC: 33.7 g/dL (ref 30.0–36.0)
MCV: 97.6 fL (ref 80.0–100.0)
Platelets: 198 10*3/uL (ref 150–400)
RBC: 4.93 MIL/uL (ref 4.22–5.81)
RDW: 13.3 % (ref 11.5–15.5)
WBC: 11.3 10*3/uL — ABNORMAL HIGH (ref 4.0–10.5)
nRBC: 0 % (ref 0.0–0.2)

## 2018-09-24 LAB — HIV ANTIBODY (ROUTINE TESTING W REFLEX): HIV Screen 4th Generation wRfx: NONREACTIVE

## 2018-09-24 MED ORDER — LOPERAMIDE HCL 2 MG PO CAPS
2.0000 mg | ORAL_CAPSULE | ORAL | Status: DC | PRN
  Administered 2018-09-24: 4 mg via ORAL
  Filled 2018-09-24 (×2): qty 1

## 2018-09-24 NOTE — Progress Notes (Signed)
Occupational Therapy Treatment Patient Details Name: Charles Chan MRN: [id number] DOB: [birth year] Today's Date: 09/24/2018    History of present illness 78 y.o. male with PMHx significant for dementia, CAD s/p PCI 2005, hx stroke, HTN, OSA not on CPAP, Colon CA s/p Colectomy in 1990s, prostate cancer, and Aflutter on Eliquis who presented with N/V/D and found to have partial SBO and sepsis from CAP   OT comments  Pt presents sitting EOB with sitter present, agreeable to participating in therapy session. Pt requiring increased assist today for dynamic mobility tasks. Performing seated UB/LB bathing with close minguard assist; toileting with modA. Pt requiring minA for functional mobility both in room and hallway using [place]. Pt appearing fatigued and with DOE 2-3/4 with activity. Pt also with x1 LOB during toileting task requiring minA to correct. Will continue to follow to progress pt towards established OT goals. Pending pt progress he may benefit from follow up therapy services after discharge- will follow.    Follow Up Recommendations  No OT follow up;Supervision/Assistance - 24 hour    Equipment Recommendations  None recommended by OT          Precautions / Restrictions Precautions Precautions: Fall Restrictions Weight Bearing Restrictions: No       Mobility Bed Mobility Overal bed mobility: Needs Assistance Bed Mobility: Sit to Supine       Sit to supine: Min guard   General bed mobility comments: for general safety, no physical assist required  Transfers Overall transfer level: Needs assistance Equipment used: None Transfers: Sit to/from Stand Sit to Stand: Min assist         General transfer comment: pt requiring increased boosting assist today to rise and steady     Balance Overall balance assessment: Needs assistance         Standing balance support: Single extremity supported;During functional activity Standing balance-Leahy Scale: Fair Standing  balance comment: more reliant on external support today                           ADL either performed or assessed with clinical judgement   ADL Overall ADL's : Needs assistance/impaired     Grooming: Wash/dry face;Set up;Sitting   Upper Body Bathing: Min guard;Sitting;Set up Upper Body Bathing Details (indicate cue type and reason): sitting EOB Lower Body Bathing: Minimal assistance;Sit to/from stand Lower Body Bathing Details (indicate cue type and reason): pt able to reach upper and lower portion of LEs Upper Body Dressing : Minimal assistance;Sitting Upper Body Dressing Details (indicate cue type and reason): donning new gown Lower Body Dressing: Minimal assistance;Sit to/from stand Lower Body Dressing Details (indicate cue type and reason): minA standing balance; pt able to doff/don new socks given increased time/effort Toilet Transfer: Minimal assistance;Stand-pivot Toilet Transfer Details (indicate cue type and reason): stand pivot to [facility] vs ambulatory to bathroomas pt actively having loose stools upon standing from EOB Toileting- Clothing Manipulation and Hygiene: Moderate assistance;Sit to/from stand Toileting - Clothing Manipulation Details (indicate cue type and reason): assist for pericare after BM     Functional mobility during ADLs: Minimal assistance;Cane General ADL Comments: pt slighltly more unsteady today requiring consistent minA with standing, x1 LOB requiring minA to correct     Vision       Perception     Praxis      Cognition Arousal/Alertness: Awake/alert Behavior During Therapy: WFL for tasks assessed/performed Overall Cognitive Status: History of cognitive impairments - at baseline  General Comments: per chart pt with hx of dementia        Exercises     Shoulder Instructions       General Comments      Pertinent Vitals/ Pain       Pain Assessment: No/denies pain  Home Living                                           Prior Functioning/Environment              Frequency  Min 2X/week        Progress Toward Goals  OT Goals(current goals can now be found in the care plan section)  Progress towards OT goals: Progressing toward goals  Acute Rehab OT Goals Patient Stated Goal: wants to see his wife OT Goal Formulation: With patient Time For Goal Achievement: 10/07/18 Potential to Achieve Goals: Good  Plan Discharge plan remains appropriate    Co-evaluation                 AM-PAC OT "6 Clicks" Daily Activity     Outcome Measure   Help from another person eating meals?: A Little Help from another person taking care of personal grooming?: A Little Help from another person toileting, which includes using toliet, bedpan, or urinal?: A Little Help from another person bathing (including washing, rinsing, drying)?: A Little Help from another person to put on and taking off regular upper body clothing?: None Help from another person to put on and taking off regular lower body clothing?: A Little 6 Click Score: 19    End of Session Equipment Utilized During Treatment: Other (comment)(cane)  OT Visit Diagnosis: Unsteadiness on feet (R26.81);Muscle weakness (generalized) (M62.81)   Activity Tolerance Patient tolerated treatment well   Patient Left in bed;with call bell/phone within reach;with nursing/sitter in room(telesitter and NT sitter)   Nurse Communication Mobility status        Time: 4081-4481 OT Time Calculation (min): 25 min  Charges: OT General Charges $OT Visit: 1 Visit OT Treatments $Self Care/Home Management : 8-22 mins $Therapeutic Activity: 8-22 mins  Lou Cal, OT [facility] Pager [phone] Office [phone]    Charles Chan 09/24/2018, 1:40 PM

## 2018-09-24 NOTE — Progress Notes (Signed)
PHARMACY - PHYSICIAN COMMUNICATION CRITICAL VALUE ALERT - BLOOD CULTURE IDENTIFICATION (BCID)  Charles Chan is an 78 y.o. male who presented to [facility] on 09/22/2018 with a chief complaint of emesis, SBO  Assessment:  1 of 4 bottles blood growing staph species - likely contaminant  Name of physician (or Provider) Contacted: [place] Silos  Current antibiotics: zithromax/rocephin for CAP  Changes to prescribed antibiotics recommended:  None, continue same  Results for orders placed or performed during the hospital encounter of 09/22/18  Blood Culture ID Panel (Reflexed) (Collected: 09/22/2018 11:59 PM)  Result Value Ref Range   Enterococcus species NOT DETECTED NOT DETECTED   Listeria monocytogenes NOT DETECTED NOT DETECTED   Staphylococcus species DETECTED (A) NOT DETECTED   Staphylococcus aureus (BCID) NOT DETECTED NOT DETECTED   Methicillin resistance NOT DETECTED NOT DETECTED   Streptococcus species NOT DETECTED NOT DETECTED   Streptococcus agalactiae NOT DETECTED NOT DETECTED   Streptococcus pneumoniae NOT DETECTED NOT DETECTED   Streptococcus pyogenes NOT DETECTED NOT DETECTED   Acinetobacter baumannii NOT DETECTED NOT DETECTED   Enterobacteriaceae species NOT DETECTED NOT DETECTED   Enterobacter cloacae complex NOT DETECTED NOT DETECTED   Escherichia coli NOT DETECTED NOT DETECTED   Klebsiella oxytoca NOT DETECTED NOT DETECTED   Klebsiella pneumoniae NOT DETECTED NOT DETECTED   Proteus species NOT DETECTED NOT DETECTED   Serratia marcescens NOT DETECTED NOT DETECTED   Haemophilus influenzae NOT DETECTED NOT DETECTED   Neisseria meningitidis NOT DETECTED NOT DETECTED   Pseudomonas aeruginosa NOT DETECTED NOT DETECTED   Candida albicans NOT DETECTED NOT DETECTED   Candida glabrata NOT DETECTED NOT DETECTED   Candida krusei NOT DETECTED NOT DETECTED   Candida parapsilosis NOT DETECTED NOT DETECTED   Candida tropicalis NOT DETECTED NOT DETECTED    Kara Mead 09/24/2018  7:30 AM

## 2018-09-24 NOTE — Progress Notes (Signed)
PROGRESS NOTE    ORRIE Chan  WFU:[id number] DOB: 03-Aug-1940 DOA: 09/22/2018 PCP: Patient, No Pcp Per   Brief Narrative:  Charles Chan is a 78 y.o. F with dementia home-dwelling, CAD s/p PCI 2005, hx stroke, HTN, OSA not on CPAP, Colon CA s/p Colectomy in 1990s, prosCA and Aflutter on Eliquis who presented with N/V/D for 2 days.  In the ER, CXR showed pneumonia.  CT abdomen and pelvis showed partial SBO.  NG placed.  Started on empiric antibiotics for CAP and admitted to hospitalist service.     Assessment & Plan:   Active Problems:   OSA (obstructive sleep apnea)   HTN (hypertension)   HLD (hyperlipidemia)   CHF (congestive heart failure) (HCC)   History of prostate cancer   CAD (coronary artery disease)   Dementia (HCC)   Atrial flutter (HCC)   BPH (benign prostatic hyperplasia)   CAP (community acquired pneumonia)   SBO (small bowel obstruction) (HCC)   COPD with chronic bronchitis (HCC)   Nausea & vomiting   Dehydration   Elevated troponin  1 partial small bowel obstruction Patient noted to have presented with nausea vomiting diarrhea x2 days.  Chest x-ray concerning for pneumonia.  CT abdomen and pelvis concerning for partial small bowel obstruction.  NG tube was placed however was pulled out 2 nights ago.  Patient with no further nausea or vomiting.  Patient denies any abdominal pain.  Patient having loose stools per RN.  Patient tolerating clears.  Will advance to full liquid diet for now.  Follow.  2.  Sepsis from community-acquired pneumonia/right middle lobe Patient had presented with respiratory rate of 27, leukocytosis and evidence of endorgan injury with elevated troponins.  Chest x-ray done concerning for right middle lobe pneumonia.  Patient afebrile.  Blood cultures with 1 out of 2 coagulase-negative staph likely contaminant.  Respiratory cultures pending.  SARS-CoV-2 negative.  Continue empiric IV Rocephin and azithromycin.  Once oral intake  continues to improve and patient tolerating a solid diet will transition to oral antibiotics.  3.  COPD Stable.  Continue Dulera.  4.  Dementia/delirium Continue delirium precautions with lights and TV off, minimize interruptions at night, blinds open and lights on during the day, glasses hearing aid, frequent reorientation, avoid sedating medications, PT/OT.  5.  Chronic atrial flutter Continue metoprolol for rate control.  Eliquis for anticoagulation.  Keep potassium greater than 4 and magnesium greater than 2.  6.  Coronary artery disease/hypertension Stable.  Continue Norvasc, aspirin, simvastatin, metoprolol.  7.  Obstructive sleep apnea  8.  History of prostate cancer/colon cancer Outpatient follow-up.  9.  Hypokalemia Repleted.  Potassium at 3.9.  Magnesium at 2.2.  10.  Elevated troponins Troponins are minimally elevated.  Patient denies any chest pain.  No further ischemic work-up needed at this time.  11.  BPH Continue Flomax.   DVT prophylaxis: Eliquis Code Status: DNR Family Communication: Updated patient.  No family at bedside. Disposition Plan: Likely home when clinically improved.   Consultants:   None  Procedures:   CT abdomen and pelvis 09/22/2018  Abdominal x-ray 09/22/2018, 09/23/2018  Chest x-ray 09/22/2018  Antimicrobials:   IV azithromycin 09/22/2018  IV Rocephin 09/22/2018   Subjective: Patient sitting up in bed.  Denies any further nausea or vomiting.  Patient with multiple loose stools per RN.  Patient denies any abdominal pain.  Tolerating clear liquids.  Objective: Vitals:   09/24/18 0545 09/24/18 0827 09/24/18 0938 09/24/18 0938  BP: (!) 138/59  Marland Kitchen)  148/84 (!) 148/84  Pulse: 92   91  Resp: 20     Temp: 98.7 F (37.1 C)     TempSrc: Oral     SpO2: 92% 93%  96%  Weight:      Height:        Intake/Output Summary (Last 24 hours) at 09/24/2018 1259 Last data filed at 09/24/2018 0815 Gross per 24 hour  Intake 240.79 ml  Output  50 ml  Net 190.79 ml   Filed Weights   09/22/18 1541 09/22/18 2232  Weight: 85 kg 81.9 kg    Examination:  General exam: Appears calm and comfortable  Respiratory system: Clear to auscultation. Respiratory effort normal. Cardiovascular system: S1 & S2 heard, RRR. No JVD, murmurs, rubs, gallops or clicks. No pedal edema. Gastrointestinal system: Abdomen is nondistended, soft and nontender. No organomegaly or masses felt. Normal bowel sounds heard. Central nervous system: Alert and oriented. No focal neurological deficits. Extremities: Symmetric 5 x 5 power. Skin: No rashes, lesions or ulcers Psychiatry: Judgement and insight appear normal. Mood & affect appropriate.     Data Reviewed: I have personally reviewed following labs and imaging studies  CBC: Recent Labs  Lab 09/22/18 1613 09/23/18 0659 09/24/18 0503  WBC 16.8* 13.0* 11.3*  NEUTROABS 15.0*  --   --   HGB 16.1 15.3 16.2  HCT 50.0 47.5 48.1  MCV 96.9 98.1 97.6  PLT 208 205 563   Basic Metabolic Panel: Recent Labs  Lab 09/22/18 1613 09/22/18 2118 09/23/18 0659 09/24/18 0503  NA 142  --  141 143  K 3.6  --  3.4* 3.9  CL 110  --  113* 115*  CO2 22  --  22 19*  GLUCOSE 119*  --  105* 90  BUN 15  --  11 12  CREATININE 1.18  --  1.01 1.04  CALCIUM 9.3  --  8.8* 9.0  MG  --  1.9 2.2  --   PHOS  --   --  2.3*  --    GFR: Estimated Creatinine Clearance: 66.4 mL/min (by C-G formula based on SCr of 1.04 mg/dL). Liver Function Tests: Recent Labs  Lab 09/22/18 1613 09/23/18 0659 09/24/18 0503  AST 18 19 23   ALT 11 11 12   ALKPHOS 84 64 60  BILITOT 1.3* 1.0 1.1  PROT 7.7 6.9 6.7  ALBUMIN 4.2 3.7 3.7   Recent Labs  Lab 09/22/18 1613  LIPASE 19   No results for input(s): AMMONIA in the last 168 hours. Coagulation Profile: No results for input(s): INR, PROTIME in the last 168 hours. Cardiac Enzymes: Recent Labs  Lab 09/22/18 2118 09/22/18 2359 09/23/18 0659  TROPONINI 0.04* 0.04* 0.04*   BNP  (last 3 results) No results for input(s): PROBNP in the last 8760 hours. HbA1C: No results for input(s): HGBA1C in the last 72 hours. CBG: No results for input(s): GLUCAP in the last 168 hours. Lipid Profile: No results for input(s): CHOL, HDL, LDLCALC, TRIG, CHOLHDL, LDLDIRECT in the last 72 hours. Thyroid Function Tests: Recent Labs    09/23/18 0659  TSH 0.685   Anemia Panel: No results for input(s): VITAMINB12, FOLATE, FERRITIN, TIBC, IRON, RETICCTPCT in the last 72 hours. Sepsis Labs: Recent Labs  Lab 09/22/18 1817  LATICACIDVEN 1.6    Recent Results (from the past 240 hour(s))  SARS Coronavirus 2 (CEPHEID- Performed in [facility] hospital lab), Hosp Order     Status: None   Collection Time: 09/22/18  4:14 PM  Result Value  Ref Range Status   SARS Coronavirus 2 NEGATIVE NEGATIVE Final    Comment: (NOTE) If result is NEGATIVE SARS-CoV-2 target nucleic acids are NOT DETECTED. The SARS-CoV-2 RNA is generally detectable in upper and lower  respiratory specimens during the acute phase of infection. The lowest  concentration of SARS-CoV-2 viral copies this assay can detect is 250  copies / mL. A negative result does not preclude SARS-CoV-2 infection  and should not be used as the sole basis for treatment or other  patient management decisions.  A negative result may occur with  improper specimen collection / handling, submission of specimen other  than nasopharyngeal swab, presence of viral mutation(s) within the  areas targeted by this assay, and inadequate number of viral copies  (<250 copies / mL). A negative result must be combined with clinical  observations, patient history, and epidemiological information. If result is POSITIVE SARS-CoV-2 target nucleic acids are DETECTED. The SARS-CoV-2 RNA is generally detectable in upper and lower  respiratory specimens dur ing the acute phase of infection.  Positive  results are indicative of active infection with SARS-CoV-2.   Clinical  correlation with patient history and other diagnostic information is  necessary to determine patient infection status.  Positive results do  not rule out bacterial infection or co-infection with other viruses. If result is PRESUMPTIVE POSTIVE SARS-CoV-2 nucleic acids MAY BE PRESENT.   A presumptive positive result was obtained on the submitted specimen  and confirmed on repeat testing.  While 2019 novel coronavirus  (SARS-CoV-2) nucleic acids may be present in the submitted sample  additional confirmatory testing may be necessary for epidemiological  and / or clinical management purposes  to differentiate between  SARS-CoV-2 and other Sarbecovirus currently known to infect humans.  If clinically indicated additional testing with an alternate test  methodology [phone]) is advised. The SARS-CoV-2 RNA is generally  detectable in upper and lower respiratory sp ecimens during the acute  phase of infection. The expected result is Negative. Fact Sheet for Patients:  StrictlyIdeas.no Fact Sheet for Healthcare Providers: BankingDealers.co.za This test is not yet approved or cleared by the [place] FDA and has been authorized for detection and/or diagnosis of SARS-CoV-2 by FDA under an Emergency Use Authorization (EUA).  This EUA will remain in effect (meaning this test can be used) for the duration of the COVID-19 declaration under Section 564(b)(1) of the Act, 21 U.S.C. section 360bbb-3(b)(1), unless the authorization is terminated or revoked sooner. Performed at [facility], [name] [address], [place], [place] [address]   Gastrointestinal Panel by PCR , Stool     Status: None   Collection Time: 09/22/18  8:27 PM  Result Value Ref Range Status   Campylobacter species NOT DETECTED NOT DETECTED Final   Plesimonas shigelloides NOT DETECTED NOT DETECTED Final   Salmonella species NOT DETECTED NOT DETECTED Final    Yersinia enterocolitica NOT DETECTED NOT DETECTED Final   Vibrio species NOT DETECTED NOT DETECTED Final   Vibrio cholerae NOT DETECTED NOT DETECTED Final   Enteroaggregative E coli (EAEC) NOT DETECTED NOT DETECTED Final   Enteropathogenic E coli (EPEC) NOT DETECTED NOT DETECTED Final   Enterotoxigenic E coli (ETEC) NOT DETECTED NOT DETECTED Final   Shiga like toxin producing E coli (STEC) NOT DETECTED NOT DETECTED Final   Shigella/Enteroinvasive E coli (EIEC) NOT DETECTED NOT DETECTED Final   Cryptosporidium NOT DETECTED NOT DETECTED Final   Cyclospora cayetanensis NOT DETECTED NOT DETECTED Final   Entamoeba histolytica NOT DETECTED NOT  DETECTED Final   Giardia lamblia NOT DETECTED NOT DETECTED Final   Adenovirus F40/41 NOT DETECTED NOT DETECTED Final   Astrovirus NOT DETECTED NOT DETECTED Final   Norovirus GI/GII NOT DETECTED NOT DETECTED Final   Rotavirus A NOT DETECTED NOT DETECTED Final   Sapovirus (I, II, IV, and V) NOT DETECTED NOT DETECTED Final    Comment: Performed at [facility], [address] [place], [place] [address]  Culture, blood (routine x 2) Call MD if unable to obtain prior to antibiotics being given     Status: None (Preliminary result)   Collection Time: 09/22/18 11:59 PM  Result Value Ref Range Status   Specimen Description   Final    BLOOD RIGHT ANTECUBITAL Performed at [facility], [place] [address], [place], [place] [address]    Special Requests   Final    BOTTLES DRAWN AEROBIC ONLY Blood Culture adequate volume Performed at [place] [address], [place], [place] [address]    Culture   Final    NO GROWTH 1 DAY Performed at [facility], [place] [address] [place], [place] [address]    Report Status PENDING  Incomplete  Culture, blood (routine x 2) Call MD if unable to obtain prior to antibiotics being given     Status: None (Preliminary result)   Collection Time: 09/22/18 11:59 PM  Result Value  Ref Range Status   Specimen Description   Final    BLOOD BLOOD RIGHT HAND Performed at [place] [address] [place], [place] [address]    Special Requests   Final    BOTTLES DRAWN AEROBIC ONLY Blood Culture adequate volume Performed at [place] [address], [place], [place] [address]    Culture  Setup Time   Final    GRAM POSITIVE COCCI IN CLUSTERS AEROBIC BOTTLE ONLY CRITICAL RESULT CALLED TO, READ BACK BY AND VERIFIED WITH: PHARMD Carollee Herter [id number] AT 2 AM BY CM Performed at [facility], [place] [address], [place], [place] [address]    Culture GRAM POSITIVE COCCI  Final   Report Status PENDING  Incomplete  Blood Culture ID Panel (Reflexed)     Status: Abnormal   Collection Time: 09/22/18 11:59 PM  Result Value Ref Range Status   Enterococcus species NOT DETECTED NOT DETECTED Final   Listeria monocytogenes NOT DETECTED NOT DETECTED Final   Staphylococcus species DETECTED (A) NOT DETECTED Final    Comment: Methicillin (oxacillin) susceptible coagulase negative staphylococcus. Possible blood culture contaminant (unless isolated from more than one blood culture draw or clinical case suggests pathogenicity). No antibiotic treatment is indicated for blood  culture contaminants. CRITICAL RESULT CALLED TO, READ BACK BY AND VERIFIED WITH: PHARMD M LILLINSTON [id number] AT 48 AM BY CM    Staphylococcus aureus (BCID) NOT DETECTED NOT DETECTED Final   Methicillin resistance NOT DETECTED NOT DETECTED Final   Streptococcus species NOT DETECTED NOT DETECTED Final   Streptococcus agalactiae NOT DETECTED NOT DETECTED Final   Streptococcus pneumoniae NOT DETECTED NOT DETECTED Final   Streptococcus pyogenes NOT DETECTED NOT DETECTED Final   Acinetobacter baumannii NOT DETECTED NOT DETECTED Final   Enterobacteriaceae species NOT DETECTED NOT DETECTED Final   Enterobacter cloacae complex NOT DETECTED NOT DETECTED Final   Escherichia coli  NOT DETECTED NOT DETECTED Final   Klebsiella oxytoca NOT DETECTED NOT DETECTED Final   Klebsiella pneumoniae NOT DETECTED NOT DETECTED Final   Proteus species NOT DETECTED  NOT DETECTED Final   Serratia marcescens NOT DETECTED NOT DETECTED Final   Haemophilus influenzae NOT DETECTED NOT DETECTED Final   Neisseria meningitidis NOT DETECTED NOT DETECTED Final   Pseudomonas aeruginosa NOT DETECTED NOT DETECTED Final   Candida albicans NOT DETECTED NOT DETECTED Final   Candida glabrata NOT DETECTED NOT DETECTED Final   Candida krusei NOT DETECTED NOT DETECTED Final   Candida parapsilosis NOT DETECTED NOT DETECTED Final   Candida tropicalis NOT DETECTED NOT DETECTED Final    Comment: Performed at [facility], [place] [address] [place], [place] [address]  Culture, sputum-assessment     Status: None   Collection Time: 09/23/18  4:30 PM  Result Value Ref Range Status   Specimen Description SPUTUM  Final   Special Requests Normal  Final   Sputum evaluation   Final    THIS SPECIMEN IS ACCEPTABLE FOR SPUTUM CULTURE Performed at [facility], [place] [address], [place], [place] [address]    Report Status 09/23/2018 FINAL  Final  Culture, respiratory     Status: None (Preliminary result)   Collection Time: 09/23/18  4:30 PM  Result Value Ref Range Status   Specimen Description   Final    SPUTUM Performed at [place] [address], [place], [place] [address]    Special Requests   Final    Normal Reflexed from [id number] Performed at [facility], [place] [address] [place], [place] [address]    Gram Stain   Final    RARE WBC PRESENT, PREDOMINANTLY PMN MODERATE SQUAMOUS EPITHELIAL CELLS PRESENT ABUNDANT GRAM POSITIVE COCCI ABUNDANT GRAM NEGATIVE RODS ABUNDANT GRAM POSITIVE RODS RARE YEAST    Culture   Final    CULTURE REINCUBATED FOR BETTER GROWTH Performed at [facility], [place] [address], [place], [place] [address]     Report Status PENDING  Incomplete         Radiology Studies: Dg Chest 2 View  Result Date: 09/22/2018 CLINICAL DATA:  Shortness of breath with cough and congestion. History of prostate cancer, myocardial infarction and dementia. EXAM: CHEST - 2 VIEW COMPARISON:  Radiographs 03/19/2018 and 03/24/2017. FINDINGS: There are lower lung volumes with mild elevation of the right hemidiaphragm. The heart size and mediastinal contours are stable. There is mild cardiomegaly and aortic atherosclerosis. There is new airspace opacity medially at the right lung base, projecting inferior to the hilum on the lateral view, and likely in the middle lobe. There is probable mild atelectasis at both lung bases. There is a stable bleb peripherally at the right lung base. No pneumothorax or significant pleural effusion. The bones appear unchanged. Telemetry leads overlie the chest. IMPRESSION: New right infrahilar airspace opacity, likely in the middle lobe, most consistent with early pneumonia. Followup PA and lateral chest X-ray is recommended in 3-4 weeks following trial of antibiotic therapy to ensure resolution and exclude underlying malignancy. Electronically Signed   By: Richardean Sale M.D.   On: 09/22/2018 16:58   Dg Abd 1 View  Result Date: 09/23/2018 CLINICAL DATA:  Follow-up small-bowel obstruction. Persistent abdominal distention. EXAM: ABDOMEN - 1 VIEW COMPARISON:  09/22/2018; CT abdomen pelvis-09/22/2018 FINDINGS: Previous identified enteric tube is no longer seen. Re demonstrated moderate gaseous distention of multiple loops of small bowel with index loop of small bowel within the mid hemiabdomen measuring 3.8 cm diameter. This finding is again associated with a relative paucity of distal colonic gas with small amount of air seen within the  ascending and transverse colon. Nondiagnostic evaluation for pneumoperitoneum secondary to supine positioning and exclusion of the lower thorax. No pneumatosis or portal  venous gas. No definitive abnormal intra-abdominal calcifications. Moderate severe multilevel degenerative change of the lumbar spine is suspected though incompletely evaluated. IMPRESSION: 1. Similar findings worrisome for partial small bowel obstruction. 2. Nonvisualization of enteric tube, potentially excluded from view. Clinical correlation is advised. Electronically Signed   By: Sandi Mariscal M.D.   On: 09/23/2018 08:45   Dg Abdomen 1 View  Result Date: 09/22/2018 CLINICAL DATA:  NG tube placement EXAM: ABDOMEN - 1 VIEW COMPARISON:  None. FINDINGS: The enteric tube terminates over the stomach. There are multiple dilated loops of small bowel. There is no pneumatosis or free air. IMPRESSION: NG tube projects over the stomach. Electronically Signed   By: Constance Holster M.D.   On: 09/22/2018 22:06   Ct Abdomen Pelvis W Contrast  Result Date: 09/22/2018 CLINICAL DATA:  Vomiting and diarrhea today.  Fever yesterday. EXAM: CT ABDOMEN AND PELVIS WITH CONTRAST TECHNIQUE: Multidetector CT imaging of the abdomen and pelvis was performed using the standard protocol following bolus administration of intravenous contrast. CONTRAST:  151mL OMNIPAQUE IOHEXOL 300 MG/ML  SOLN COMPARISON:  Abdominopelvic CT 03/24/2017. FINDINGS: Lower chest: Advanced emphysematous changes at both lung bases with central airway thickening and fibrosis. Lucency anteriorly at the right costophrenic angle is unchanged, favored to reflect a bleb over a chronic loculated pneumothorax based on stability. No pleural or pericardial effusion. There is aortic and coronary artery atherosclerosis. Hepatobiliary: Multiple hepatic cysts are grossly stable. There are no suspicious liver lesions. There are multiple gallstones. No evidence of gallbladder wall thickening, surrounding inflammation or biliary dilatation. Pancreas: Unremarkable. No pancreatic ductal dilatation or surrounding inflammatory changes. Spleen: Normal in size without focal  abnormality. Adrenals/Urinary Tract: Both adrenal glands appear normal. There are stable bilateral renal cysts, largest in the lower pole of the left kidney. No evidence of urinary tract calculus or hydronephrosis. The bladder appears unremarkable. Stomach/Bowel: The stomach and proximal small bowel appear normal. There are multiple mildly dilated loops of small bowel which are fluid-filled with scattered air-fluid levels. There is gradual transition to normal caliber terminal ileum, and no focal transition point is identified. The colon is decompressed. The appendix appears normal. There are mild diverticular changes throughout the distal colon without wall thickening or surrounding inflammation. Vascular/Lymphatic: There are no enlarged abdominal or pelvic lymph nodes. Stable small lymph nodes in the porta hepatis. There is extensive aortic and branch vessel atherosclerosis. The left external iliac artery is completely occluded over an approximately 7 cm segment. There is distal reconstitution of the left femoral artery. The mesenteric vessels appear patent. Reproductive: Fiducial clips in the prostate gland. Other: There is no ascites, focal extraluminal fluid collection or pneumoperitoneum. No evidence of abdominal wall hernia. Musculoskeletal: No acute or significant osseous findings. Stable degenerative changes in the lumbar spine associated with a convex right scoliosis. There is a simple lipoma posteriorly in the lower right chest, superficial to the right scapular tip, incompletely visualized. IMPRESSION: 1. Findings suggest an early or partial distal small bowel obstruction, etiology not determined. 2. No evidence bowel perforation, abscess or acute inflammation. The appendix appears normal. 3. No evidence of metastatic prostate cancer. 4. Severe Aortic Atherosclerosis (ICD10-I70.0). 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 Coronary artery atherosclerosis. 5.  Additional incidental findings including cholelithiasis, colonic diverticulosis and chronic basilar lung disease. Electronically Signed   By: Caryl Comes.D.  On: 09/22/2018 18:09        Scheduled Meds:  amLODipine  10 mg Oral Daily   apixaban  5 mg Oral BID   aspirin EC  81 mg Oral Daily   diatrizoate meglumine-sodium  90 mL Per NG tube Once   metoprolol succinate  25 mg Oral Daily   mometasone-formoterol  2 puff Inhalation BID   simvastatin  20 mg Oral q1800   Continuous Infusions:  sodium chloride 20 mL/hr at 09/24/18 0127   azithromycin Stopped (09/24/18 0133)   cefTRIAXone (ROCEPHIN)  IV       LOS: 2 days    Time spent: 35 minutes    Irine Seal, MD [facility]  If 7PM-7AM, please contact night-coverage [email or web address] 09/24/2018, 12:59 PM

## 2018-09-24 NOTE — Progress Notes (Signed)
Pt. Left forearm I.V infiltrated. Cold, swollen, red lump under skin. Pt. C/O pain at site. I.V have been D/C. Arm iced and elevated. On-call made aware. Will continue to monitor.

## 2018-09-24 NOTE — Progress Notes (Signed)
OT Cancellation Note  Patient Details Name: Charles Chan MRN: [id number] DOB: [birth year]   Cancelled Treatment:    Reason Eval/Treat Not Completed: Other (comment); pt sleeping soundly upon entering room, currently with sitter and telesitter in room. Will follow up for OT treatment as schedule permits.  Lou Cal, OT [facility] Pager [phone] Office [phone]   Charles Chan 09/24/2018, 10:23 AM

## 2018-09-25 LAB — CULTURE, RESPIRATORY W GRAM STAIN
Culture: NORMAL
Special Requests: NORMAL

## 2018-09-25 LAB — COMPREHENSIVE METABOLIC PANEL
ALT: 11 U/L (ref 0–44)
AST: 17 U/L (ref 15–41)
Albumin: 3.1 g/dL — ABNORMAL LOW (ref 3.5–5.0)
Alkaline Phosphatase: 50 U/L (ref 38–126)
Anion gap: 5 (ref 5–15)
BUN: 13 mg/dL (ref 8–23)
CO2: 17 mmol/L — ABNORMAL LOW (ref 22–32)
Calcium: 8.2 mg/dL — ABNORMAL LOW (ref 8.9–10.3)
Chloride: 119 mmol/L — ABNORMAL HIGH (ref 98–111)
Creatinine, Ser: 0.9 mg/dL (ref 0.61–1.24)
GFR calc Af Amer: >60 mL/min (ref >60)
GFR calc non Af Amer: >60 mL/min (ref >60)
Glucose, Bld: 88 mg/dL (ref 70–99)
Potassium: 3.7 mmol/L (ref 3.5–5.1)
Sodium: 141 mmol/L (ref 135–145)
Total Bilirubin: 0.7 mg/dL (ref 0.3–1.2)
Total Protein: 5.5 g/dL — ABNORMAL LOW (ref 6.5–8.1)

## 2018-09-25 LAB — CBC
HCT: 42.3 % (ref 39.0–52.0)
Hemoglobin: 13.9 g/dL (ref 13.0–17.0)
MCH: 32.3 pg (ref 26.0–34.0)
MCHC: 32.9 g/dL (ref 30.0–36.0)
MCV: 98.4 fL (ref 80.0–100.0)
Platelets: 199 10*3/uL (ref 150–400)
RBC: 4.3 MIL/uL (ref 4.22–5.81)
RDW: 13.2 % (ref 11.5–15.5)
WBC: 8.3 10*3/uL (ref 4.0–10.5)
nRBC: 0 % (ref 0.0–0.2)

## 2018-09-25 LAB — MAGNESIUM: Magnesium: 2.1 mg/dL (ref 1.7–2.4)

## 2018-09-25 MED ORDER — SODIUM BICARBONATE 650 MG PO TABS
650.0000 mg | ORAL_TABLET | Freq: Two times a day (BID) | ORAL | Status: DC
  Administered 2018-09-25 – 2018-09-26 (×3): 650 mg via ORAL
  Filled 2018-09-25 (×3): qty 1

## 2018-09-25 MED ORDER — AZITHROMYCIN 250 MG PO TABS
500.0000 mg | ORAL_TABLET | Freq: Every day | ORAL | Status: DC
  Administered 2018-09-25: 500 mg via ORAL
  Filled 2018-09-25: qty 2

## 2018-09-25 MED ORDER — CEFDINIR 300 MG PO CAPS
300.0000 mg | ORAL_CAPSULE | Freq: Two times a day (BID) | ORAL | Status: DC
  Administered 2018-09-25 – 2018-09-26 (×2): 300 mg via ORAL
  Filled 2018-09-25 (×2): qty 1

## 2018-09-25 NOTE — Care Management Important Message (Signed)
Important Message  Patient Details IM Letter given to Marney Doctor Rn to present to the Patient Name: Charles Chan MRN: [id number] Date of Birth: 1940-07-25   Medicare Important Message Given:  Yes    [name] 09/25/2018, 10:58 AM

## 2018-09-25 NOTE — Progress Notes (Signed)
PHARMACIST - PHYSICIAN COMMUNICATION DR:   [facility] CONCERNING: Antibiotic IV to Oral Route Change Policy  RECOMMENDATION: This patient is receiving zithromax by the intravenous route.  Based on criteria approved by the Pharmacy and Therapeutics Committee, the antibiotic(s) is/are being converted to the equivalent oral dose form(s).   DESCRIPTION: These criteria include:  Patient being treated for a respiratory tract infection, urinary tract infection, cellulitis or clostridium difficile associated diarrhea if on metronidazole  The patient is not neutropenic and does not exhibit a GI malabsorption state  The patient is eating (either orally or via tube) and/or has been taking other orally administered medications for a least 24 hours  The patient is improving clinically and has a Tmax < 100.5  If you have questions about this conversion, please contact the Pharmacy Department  []   [phone] )  [facility] []   [phone] )  [facility] []   [phone] )  [facility] []   [phone] )  [facility] [x]   [phone] )  [place], PharmD, BCPS 09/25/2018 10:48 AM

## 2018-09-25 NOTE — Progress Notes (Signed)
PROGRESS NOTE    Charles Chan  EZM:[id number] DOB: 1940/08/05 DOA: 09/22/2018 PCP: Patient, No Pcp Per   Brief Narrative:  Charles Chan is a 78 y.o. F with dementia home-dwelling, CAD s/p PCI 2005, hx stroke, HTN, OSA not on CPAP, Colon CA s/p Colectomy in 1990s, prosCA and Aflutter on Eliquis who presented with N/V/D for 2 days.  In the ER, CXR showed pneumonia.  CT abdomen and pelvis showed partial SBO.  NG placed.  Started on empiric antibiotics for CAP and admitted to hospitalist service.     Assessment & Plan:   Active Problems:   OSA (obstructive sleep apnea)   HTN (hypertension)   HLD (hyperlipidemia)   CHF (congestive heart failure) (HCC)   History of prostate cancer   CAD (coronary artery disease)   Dementia (HCC)   Atrial flutter (HCC)   BPH (benign prostatic hyperplasia)   CAP (community acquired pneumonia)   SBO (small bowel obstruction) (HCC)   COPD with chronic bronchitis (HCC)   Nausea & vomiting   Dehydration   Elevated troponin  1 partial small bowel obstruction Patient noted to have presented with nausea vomiting diarrhea x2 days.  Chest x-ray concerning for pneumonia.  CT abdomen and pelvis concerning for partial small bowel obstruction.  NG tube was placed however was pulled out 2 nights ago.  Patient with no further nausea or vomiting.  Patient denies any abdominal pain.  Patient having soft stools per nurse tech.  Tolerating full liquid diet.  Advance to soft diet. Follow.  2.  Sepsis from community-acquired pneumonia/right middle lobe Patient had presented with respiratory rate of 27, leukocytosis and evidence of endorgan injury with elevated troponins.  Chest x-ray done concerning for right middle lobe pneumonia.  Patient afebrile.  Blood cultures with 1 out of 2 coagulase-negative staph likely contaminant.  Respiratory cultures pending.  SARS-CoV-2 negative.  Discontinue IV Rocephin and placed on oral Vantin.  Continue azithromycin.  Patient  tolerating full liquid diet.  Follow.   3.  COPD Continue Dulera.    4.  Dementia/delirium Continue delirium precautions with lights and TV off, minimize interruptions at night, blinds open and lights on during the day, glasses hearing aid, frequent reorientation, avoid sedating medications, PT/OT.  5.  Chronic atrial flutter Continue metoprolol for rate control.  Eliquis for anticoagulation.  Keep potassium greater than 4 and magnesium greater than 2.  6.  Coronary artery disease/hypertension Stable.  Norvasc, aspirin, simvastatin, metoprolol.    7.  Obstructive sleep apnea  8.  History of prostate cancer/colon cancer Outpatient follow-up.  9.  Hypokalemia Repleted.  Potassium at 3.7.  Magnesium at 2.1.  10.  Elevated troponins Troponins are minimally elevated.  Patient denies any chest pain.  No further ischemic work-up needed at this time.  11.  BPH Continue Flomax.   DVT prophylaxis: Eliquis Code Status: DNR Family Communication: Updated patient.  No family at bedside. Disposition Plan: Likely home when clinically improved hopefully tomorrow.   Consultants:   None  Procedures:   CT abdomen and pelvis 09/22/2018  Abdominal x-ray 09/22/2018, 09/23/2018  Chest x-ray 09/22/2018  Antimicrobials:   IV azithromycin 09/22/2018>>> oral azithromycin  IV Rocephin 09/22/2018>>>>> 09/25/2018  Oral Vantin 09/25/2018   Subjective: Patient in bed.  Denies any chest pain or shortness of breath.  No abdominal pain.  Having bowel movements.  Tolerating full liquid diet.    Objective: Vitals:   09/24/18 0938 09/24/18 1441 09/24/18 2143 09/25/18 0539  BP: (!) 148/84 (!) 149/77 Marland Kitchen)  170/72 124/82  Pulse: 91 92 86 72  Resp:  20 20 18   Temp:  97.6 F (36.4 C) 98.1 F (36.7 C) 97.9 F (36.6 C)  TempSrc:  Oral Oral Oral  SpO2: 96% 95% 98% 93%  Weight:      Height:        Intake/Output Summary (Last 24 hours) at 09/25/2018 1214 Last data filed at 09/25/2018 1109 Gross per  24 hour  Intake 2155.73 ml  Output 250 ml  Net 1905.73 ml   Filed Weights   09/22/18 1541 09/22/18 2232  Weight: 85 kg 81.9 kg    Examination:  General exam: NAD Respiratory system: CTAB.  No wheezes, no crackles, no rhonchi.   Cardiovascular system: Regular rate rhythm no murmurs rubs or gallops.  No JVD.  No lower extremity edema.  Gastrointestinal system: Abdomen is soft, nontender, nondistended, positive bowel sounds.  No rebound.  No guarding.   Central nervous system: Alert and oriented. No focal neurological deficits. Extremities: Symmetric 5 x 5 power. Skin: No rashes, lesions or ulcers Psychiatry: Judgement and insight appear normal. Mood & affect appropriate.     Data Reviewed: I have personally reviewed following labs and imaging studies  CBC: Recent Labs  Lab 09/22/18 1613 09/23/18 0659 09/24/18 0503 09/25/18 0532  WBC 16.8* 13.0* 11.3* 8.3  NEUTROABS 15.0*  --   --   --   HGB 16.1 15.3 16.2 13.9  HCT 50.0 47.5 48.1 42.3  MCV 96.9 98.1 97.6 98.4  PLT 208 205 198 585   Basic Metabolic Panel: Recent Labs  Lab 09/22/18 1613 09/22/18 2118 09/23/18 0659 09/24/18 0503 09/25/18 0532  NA 142  --  141 143 141  K 3.6  --  3.4* 3.9 3.7  CL 110  --  113* 115* 119*  CO2 22  --  22 19* 17*  GLUCOSE 119*  --  105* 90 88  BUN 15  --  11 12 13   CREATININE 1.18  --  1.01 1.04 0.90  CALCIUM 9.3  --  8.8* 9.0 8.2*  MG  --  1.9 2.2  --  2.1  PHOS  --   --  2.3*  --   --    GFR: Estimated Creatinine Clearance: 76.7 mL/min (by C-G formula based on SCr of 0.9 mg/dL). Liver Function Tests: Recent Labs  Lab 09/22/18 1613 09/23/18 0659 09/24/18 0503 09/25/18 0532  AST 18 19 23 17   ALT 11 11 12 11   ALKPHOS 84 64 60 50  BILITOT 1.3* 1.0 1.1 0.7  PROT 7.7 6.9 6.7 5.5*  ALBUMIN 4.2 3.7 3.7 3.1*   Recent Labs  Lab 09/22/18 1613  LIPASE 19   No results for input(s): AMMONIA in the last 168 hours. Coagulation Profile: No results for input(s): INR, PROTIME in  the last 168 hours. Cardiac Enzymes: Recent Labs  Lab 09/22/18 2118 09/22/18 2359 09/23/18 0659  TROPONINI 0.04* 0.04* 0.04*   BNP (last 3 results) No results for input(s): PROBNP in the last 8760 hours. HbA1C: No results for input(s): HGBA1C in the last 72 hours. CBG: No results for input(s): GLUCAP in the last 168 hours. Lipid Profile: No results for input(s): CHOL, HDL, LDLCALC, TRIG, CHOLHDL, LDLDIRECT in the last 72 hours. Thyroid Function Tests: Recent Labs    09/23/18 0659  TSH 0.685   Anemia Panel: No results for input(s): VITAMINB12, FOLATE, FERRITIN, TIBC, IRON, RETICCTPCT in the last 72 hours. Sepsis Labs: Recent Labs  Lab 09/22/18 1817  LATICACIDVEN 1.6  Recent Results (from the past 240 hour(s))  SARS Coronavirus 2 (CEPHEID- Performed in [facility] lab), Hosp Order     Status: None   Collection Time: 09/22/18  4:14 PM  Result Value Ref Range Status   SARS Coronavirus 2 NEGATIVE NEGATIVE Final    Comment: (NOTE) If result is NEGATIVE SARS-CoV-2 target nucleic acids are NOT DETECTED. The SARS-CoV-2 RNA is generally detectable in upper and lower  respiratory specimens during the acute phase of infection. The lowest  concentration of SARS-CoV-2 viral copies this assay can detect is 250  copies / mL. A negative result does not preclude SARS-CoV-2 infection  and should not be used as the sole basis for treatment or other  patient management decisions.  A negative result may occur with  improper specimen collection / handling, submission of specimen other  than nasopharyngeal swab, presence of viral mutation(s) within the  areas targeted by this assay, and inadequate number of viral copies  (<250 copies / mL). A negative result must be combined with clinical  observations, patient history, and epidemiological information. If result is POSITIVE SARS-CoV-2 target nucleic acids are DETECTED. The SARS-CoV-2 RNA is generally detectable in upper and  lower  respiratory specimens dur ing the acute phase of infection.  Positive  results are indicative of active infection with SARS-CoV-2.  Clinical  correlation with patient history and other diagnostic information is  necessary to determine patient infection status.  Positive results do  not rule out bacterial infection or co-infection with other viruses. If result is PRESUMPTIVE POSTIVE SARS-CoV-2 nucleic acids MAY BE PRESENT.   A presumptive positive result was obtained on the submitted specimen  and confirmed on repeat testing.  While 2019 novel coronavirus  (SARS-CoV-2) nucleic acids may be present in the submitted sample  additional confirmatory testing may be necessary for epidemiological  and / or clinical management purposes  to differentiate between  SARS-CoV-2 and other Sarbecovirus currently known to infect humans.  If clinically indicated additional testing with an alternate test  methodology [phone]) is advised. The SARS-CoV-2 RNA is generally  detectable in upper and lower respiratory sp ecimens during the acute  phase of infection. The expected result is Negative. Fact Sheet for Patients:  StrictlyIdeas.no Fact Sheet for Healthcare Providers: BankingDealers.co.za This test is not yet approved or cleared by the [place] FDA and has been authorized for detection and/or diagnosis of SARS-CoV-2 by FDA under an Emergency Use Authorization (EUA).  This EUA will remain in effect (meaning this test can be used) for the duration of the COVID-19 declaration under Section 564(b)(1) of the Act, 21 U.S.C. section 360bbb-3(b)(1), unless the authorization is terminated or revoked sooner. Performed at [facility], [place] [address] [place], [place] [address]   Gastrointestinal Panel by PCR , Stool     Status: None   Collection Time: 09/22/18  8:27 PM  Result Value Ref Range Status   Campylobacter species NOT  DETECTED NOT DETECTED Final   Plesimonas shigelloides NOT DETECTED NOT DETECTED Final   Salmonella species NOT DETECTED NOT DETECTED Final   Yersinia enterocolitica NOT DETECTED NOT DETECTED Final   Vibrio species NOT DETECTED NOT DETECTED Final   Vibrio cholerae NOT DETECTED NOT DETECTED Final   Enteroaggregative E coli (EAEC) NOT DETECTED NOT DETECTED Final   Enteropathogenic E coli (EPEC) NOT DETECTED NOT DETECTED Final   Enterotoxigenic E coli (ETEC) NOT DETECTED NOT DETECTED Final   Shiga like toxin producing E coli (STEC) NOT DETECTED NOT  DETECTED Final   Shigella/Enteroinvasive E coli (EIEC) NOT DETECTED NOT DETECTED Final   Cryptosporidium NOT DETECTED NOT DETECTED Final   Cyclospora cayetanensis NOT DETECTED NOT DETECTED Final   Entamoeba histolytica NOT DETECTED NOT DETECTED Final   Giardia lamblia NOT DETECTED NOT DETECTED Final   Adenovirus F40/41 NOT DETECTED NOT DETECTED Final   Astrovirus NOT DETECTED NOT DETECTED Final   Norovirus GI/GII NOT DETECTED NOT DETECTED Final   Rotavirus A NOT DETECTED NOT DETECTED Final   Sapovirus (I, II, IV, and V) NOT DETECTED NOT DETECTED Final    Comment: Performed at [facility], [address] [place], [place] [address]  Culture, blood (routine x 2) Call MD if unable to obtain prior to antibiotics being given     Status: None (Preliminary result)   Collection Time: 09/22/18 11:59 PM  Result Value Ref Range Status   Specimen Description   Final    BLOOD RIGHT ANTECUBITAL Performed at [facility] [address], [place], [place] [address]    Special Requests   Final    BOTTLES DRAWN AEROBIC ONLY Blood Culture adequate volume Performed at [facility] [address], [place], [place] [address]    Culture   Final    NO GROWTH 1 DAY Performed at [facility], [place] [address] [place], [place] [address]    Report Status PENDING  Incomplete  Culture, blood (routine x 2) Call MD  if unable to obtain prior to antibiotics being given     Status: None (Preliminary result)   Collection Time: 09/22/18 11:59 PM  Result Value Ref Range Status   Specimen Description   Final    BLOOD BLOOD RIGHT HAND Performed at [facility] [address] [place], [place] [address]    Special Requests   Final    BOTTLES DRAWN AEROBIC ONLY Blood Culture adequate volume Performed at [facility] [address] [place], [place] [address]    Culture  Setup Time   Final    GRAM POSITIVE COCCI IN CLUSTERS AEROBIC BOTTLE ONLY CRITICAL RESULT CALLED TO, READ BACK BY AND VERIFIED WITH: PHARMD Carollee Herter [id number] AT 50 AM BY CM Performed at [facility], [place] [address] [place], [place] [address]    Culture GRAM POSITIVE COCCI  Final   Report Status PENDING  Incomplete  Blood Culture ID Panel (Reflexed)     Status: Abnormal   Collection Time: 09/22/18 11:59 PM  Result Value Ref Range Status   Enterococcus species NOT DETECTED NOT DETECTED Final   Listeria monocytogenes NOT DETECTED NOT DETECTED Final   Staphylococcus species DETECTED (A) NOT DETECTED Final    Comment: Methicillin (oxacillin) susceptible coagulase negative staphylococcus. Possible blood culture contaminant (unless isolated from more than one blood culture draw or clinical case suggests pathogenicity). No antibiotic treatment is indicated for blood  culture contaminants. CRITICAL RESULT CALLED TO, READ BACK BY AND VERIFIED WITH: PHARMD M LILLINSTON [id number] AT 37 AM BY CM    Staphylococcus aureus (BCID) NOT DETECTED NOT DETECTED Final   Methicillin resistance NOT DETECTED NOT DETECTED Final   Streptococcus species NOT DETECTED NOT DETECTED Final   Streptococcus agalactiae NOT DETECTED NOT DETECTED Final   Streptococcus pneumoniae NOT DETECTED NOT DETECTED Final   Streptococcus pyogenes NOT DETECTED NOT DETECTED Final   Acinetobacter baumannii NOT DETECTED NOT DETECTED Final    Enterobacteriaceae species NOT DETECTED NOT DETECTED Final   Enterobacter cloacae complex NOT  DETECTED NOT DETECTED Final   Escherichia coli NOT DETECTED NOT DETECTED Final   Klebsiella oxytoca NOT DETECTED NOT DETECTED Final   Klebsiella pneumoniae NOT DETECTED NOT DETECTED Final   Proteus species NOT DETECTED NOT DETECTED Final   Serratia marcescens NOT DETECTED NOT DETECTED Final   Haemophilus influenzae NOT DETECTED NOT DETECTED Final   Neisseria meningitidis NOT DETECTED NOT DETECTED Final   Pseudomonas aeruginosa NOT DETECTED NOT DETECTED Final   Candida albicans NOT DETECTED NOT DETECTED Final   Candida glabrata NOT DETECTED NOT DETECTED Final   Candida krusei NOT DETECTED NOT DETECTED Final   Candida parapsilosis NOT DETECTED NOT DETECTED Final   Candida tropicalis NOT DETECTED NOT DETECTED Final    Comment: Performed at [facility], [place] [address], [place], [place] [address]  Culture, sputum-assessment     Status: None   Collection Time: 09/23/18  4:30 PM  Result Value Ref Range Status   Specimen Description SPUTUM  Final   Special Requests Normal  Final   Sputum evaluation   Final    THIS SPECIMEN IS ACCEPTABLE FOR SPUTUM CULTURE Performed at [facility], [place] [address], [place], [place] [address]    Report Status 09/23/2018 FINAL  Final  Culture, respiratory     Status: None   Collection Time: 09/23/18  4:30 PM  Result Value Ref Range Status   Specimen Description   Final    SPUTUM Performed at [facility] [address], [place], [place] [address]    Special Requests   Final    Normal Reflexed from [id number] Performed at [facility], [place] [address], [place], [place] [address]    Gram Stain   Final    RARE WBC PRESENT, PREDOMINANTLY PMN MODERATE SQUAMOUS EPITHELIAL CELLS PRESENT ABUNDANT GRAM POSITIVE COCCI ABUNDANT GRAM NEGATIVE RODS ABUNDANT GRAM POSITIVE RODS RARE YEAST    Culture   Final     Consistent with normal respiratory flora. Performed at [facility], [place] [address], [place], [place] [address]    Report Status 09/25/2018 FINAL  Final         Radiology Studies: No results found.      Scheduled Meds: . amLODipine  10 mg Oral Daily  . apixaban  5 mg Oral BID  . aspirin EC  81 mg Oral Daily  . azithromycin  500 mg Oral QHS  . diatrizoate meglumine-sodium  90 mL Per NG tube Once  . metoprolol succinate  25 mg Oral Daily  . mometasone-formoterol  2 puff Inhalation BID  . simvastatin  20 mg Oral q1800  . sodium bicarbonate  650 mg Oral BID   Continuous Infusions: . sodium chloride 50 mL/hr at 09/25/18 0922  . cefTRIAXone (ROCEPHIN)  IV Stopped (09/24/18 2032)     LOS: 3 days    Time spent: 35 minutes    Irine Seal, MD [facility]  If 7PM-7AM, please contact night-coverage [email or web address] 09/25/2018, 12:14 PM

## 2018-09-26 LAB — COMPREHENSIVE METABOLIC PANEL
ALT: 11 U/L (ref 0–44)
AST: 19 U/L (ref 15–41)
Albumin: 3.7 g/dL (ref 3.5–5.0)
Alkaline Phosphatase: 60 U/L (ref 38–126)
Anion gap: 6 (ref 5–15)
BUN: 10 mg/dL (ref 8–23)
CO2: 20 mmol/L — ABNORMAL LOW (ref 22–32)
Calcium: 8.7 mg/dL — ABNORMAL LOW (ref 8.9–10.3)
Chloride: 110 mmol/L (ref 98–111)
Creatinine, Ser: 1.03 mg/dL (ref 0.61–1.24)
GFR calc Af Amer: >60 mL/min (ref >60)
GFR calc non Af Amer: >60 mL/min (ref >60)
Glucose, Bld: 87 mg/dL (ref 70–99)
Potassium: 3.5 mmol/L (ref 3.5–5.1)
Sodium: 136 mmol/L (ref 135–145)
Total Bilirubin: 0.8 mg/dL (ref 0.3–1.2)
Total Protein: 6.3 g/dL — ABNORMAL LOW (ref 6.5–8.1)

## 2018-09-26 LAB — CULTURE, BLOOD (ROUTINE X 2): Special Requests: ADEQUATE

## 2018-09-26 MED ORDER — MOMETASONE FURO-FORMOTEROL FUM 100-5 MCG/ACT IN AERO: 2.0000 | INHALATION_SPRAY | Freq: Two times a day (BID) | RESPIRATORY_TRACT | 0 refills | Status: AC

## 2018-09-26 MED ORDER — POTASSIUM CHLORIDE 20 MEQ PO PACK
40.0000 meq | PACK | Freq: Once | ORAL | Status: AC
  Administered 2018-09-26: 40 meq via ORAL
  Filled 2018-09-26: qty 2

## 2018-09-26 MED ORDER — CEFDINIR 300 MG PO CAPS: 300.0000 mg | ORAL_CAPSULE | Freq: Two times a day (BID) | ORAL | 0 refills | Status: AC

## 2018-09-26 MED ORDER — AZITHROMYCIN 250 MG PO TABS: 250.0000 mg | ORAL_TABLET | Freq: Every day | ORAL | 0 refills | Status: AC

## 2018-09-26 NOTE — Discharge Summary (Signed)
Physician Discharge Summary  Charles Chan PJK:[id number] DOB: 1940-10-05 DOA: 09/22/2018  PCP: Patient, No Pcp Per  Admit date: 09/22/2018 Discharge date: 09/26/2018  Time spent: 50 minutes  Recommendations for Outpatient Follow-up:  1. Follow-up with PCP in 2 weeks.  On follow-up patient will need a basic metabolic profile done to follow-up on electrolytes and renal function.   Discharge Diagnoses:  Principal Problem:   SBO (small bowel obstruction) (HCC) Active Problems:   CAP (community acquired pneumonia)   OSA (obstructive sleep apnea)   HTN (hypertension)   HLD (hyperlipidemia)   CHF (congestive heart failure) (HCC)   History of prostate cancer   CAD (coronary artery disease)   Dementia (HCC)   Atrial flutter (HCC)   BPH (benign prostatic hyperplasia)   COPD with chronic bronchitis (HCC)   Nausea & vomiting   Dehydration   Elevated troponin   Discharge Condition: Stable and improved  Diet recommendation: Heart healthy  Filed Weights   09/22/18 1541 09/22/18 2232  Weight: 85 kg 81.9 kg    History of present illness:  Per Charles Chan is a 78 y.o. male with medical history significant of HTN, CAD, prostate cancer, dementia, HLD, HTN, OSA not on CPAP Atrial flutter   Presented with started 1 day prior to admission,  Nausea, vomiting, diarrhea   this morning initially better but the reccured.    1 day prior to admission he had a fever up to 101.1 today down to 97.9 no vomiting since arrival to emergency department patient has dementia unable to provide detailed history.  Per family no chest pain. He has chronic cough for the past 1 year  History obtained through chart Wife reports he has had regular BM up until 2 days  Wife reports hx of colon cancer needing surgery in [place]  in 1990's-2000 and prostate surgery in 2008   Infectious risk factors:  Reports  fever, shortness of breath, dry cough  N/V/Diarrhea/abdominal pain,    In RAPID  COVID TEST NEGATIVE     Regarding pertinent Chronic problems:     Hyperlipidemia -  on statins Simvastatin   HTN on amlodipine ,    Toprolol   CHF  last echo 12/10/2016  LV EF: 55% - 60% moderate concentric hypertrophy.    CAD  - On Aspirin, statin, betablocker,   sp stent                  -  followed by cardiology   OSA  Not on CPAP  BPH on flomax    A. 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 Chest x-ray showing right infrahilar airspace opacity middle lobe early pneumonia. CT abdomen showed early or partial distal small bowel obstruction severe aortic atherosclerosis  The following Work up has been ordered so far:     Orders Placed This Encounter  Procedures  . SARS Coronavirus 2 (CEPHEID- Performed in [place] hospital lab), [facility]  . Gastrointestinal Panel by PCR , Stool  . DG Chest 2 View  . CT Abdomen Pelvis W Contrast  . Comprehensive metabolic panel  .  CBC with Differential  . Lipase, blood  . Urinalysis, Routine w reflex microscopic  . Troponin I - Add-On to previous collection  . Cardiac monitoring  . Consult to hospitalist  . Enteric precautions (UV disinfection) C difficile, Norovirus  . ED EKG  . EKG 12-Lead  . Admit to Inpatient (patient's expected length of stay will be greater than 2 midnights or inpatient only procedure)     Following Medications were ordered in ER: Medications  sodium chloride (PF) 0.9 % injection (has no administration in time range)  azithromycin (ZITHROMAX) 500 mg in sodium chloride 0.9 % 250 mL IVPB (500 mg Intravenous New Bag/Given 09/22/18 2006)  iohexol (OMNIPAQUE) 300 MG/ML solution 100 mL (100 mLs Intravenous Contrast Given 09/22/18 1725)   cefTRIAXone (ROCEPHIN) 1 g in sodium chloride 0.9 % 100 mL IVPB (0 g Intravenous Stopped 09/22/18 2000)  sodium chloride 0.9 % bolus 500 mL (500 mLs Intravenous New Bag/Given 09/22/18 2014)     Hospital Course:  1 partial small bowel obstruction Patient noted to have presented with nausea vomiting diarrhea x2 days prior to admission.  Chest x-ray concerning for pneumonia.  CT abdomen and pelvis concerning for partial small bowel obstruction.  NG tube was placed however was pulled out during the hospitalization.  Patient was hydrated with IV fluids and placed on bowel rest.  Patient improved clinically.  Had no further nausea or vomiting.  Abdominal pain improved.  Patient noted to have bowel movements which were more formed.  Patient started on a clear liquid diet which he tolerated and subsequently advanced to a full liquid diet and then a soft diet which he tolerated.  Patient improved clinically and partial small bowel obstruction had resolved by day of discharge.  Outpatient follow-up with PCP.   2.  Sepsis from community-acquired pneumonia/right middle lobe Patient had presented with respiratory rate of 27, leukocytosis and evidence of endorgan injury with elevated troponins.  Chest x-ray done concerning for right middle lobe pneumonia.  Patient afebrile.  Blood cultures with 1 out of 2 coagulase-negative staph likely contaminant.  Patient was admitted placed empirically on IV Rocephin and azithromycin. SARS-CoV-2 negative.  Respiratory cultures consistent with normal respiratory flora.  Patient improved clinically.  Patient subsequently transitioned from IV Rocephin and azithromycin to oral Vantin and oral azithromycin which he tolerated.  Patient be discharged home on 4 more days of oral antibiotics to complete a one-week course of antibiotic treatment.  Outpatient follow-up with PCP.  3.  COPD Patient placed on Dulera.    4.  Dementia/delirium Patient was maintained on delirium  precautions with lights and TV off, minimize interruptions at night, blinds open and lights on during the day, glasses hearing aid, frequent reorientation, avoid sedating medications, PT/OT.  5.  Chronic atrial flutter Patient maintained on metoprolol for rate control.  Eliquis for anticoagulation.    6.  Coronary artery disease/hypertension Stable.   Patient was maintained on home regimen of Norvasc, aspirin, simvastatin, metoprolol.    7.  Obstructive sleep apnea  8.  History of prostate cancer/colon cancer Outpatient follow-up.  9.  Hypokalemia Repleted.    10.  Elevated troponins Troponins are minimally elevated.  Patient denied any chest pain.  No further ischemic work-up needed at this time.  11.  BPH Patient was maintained on home regimen of Flomax.   Procedures:  CT abdomen and pelvis 09/22/2018  Abdominal x-ray 09/22/2018, 09/23/2018  Chest x-ray 09/22/2018   Consultations:  None  Discharge Exam: Vitals:   09/26/18  0645 09/26/18 0726  BP: (!) 152/74   Pulse: 75   Resp: 16   Temp: 98.6 F (37 C)   SpO2: 96% 91%    General: NAD Cardiovascular: RRR Respiratory: CTAB  Discharge Instructions   Discharge Instructions    Diet - low sodium heart healthy   Complete by:  As directed    Increase activity slowly   Complete by:  As directed      Allergies as of 09/26/2018      Reactions   Shellfish Allergy Hives      Medication List    STOP taking these medications   docusate sodium 100 MG capsule Commonly known as:  COLACE     TAKE these medications   albuterol (2.5 MG/3ML) 0.083% nebulizer solution Commonly known as:  PROVENTIL Take 2.5 mg by nebulization at bedtime.   amLODipine 10 MG tablet Commonly known as:  NORVASC TAKE 1 TABLET BY MOUTH ONCE DAILY   aspirin 81 MG EC tablet Take 81 mg by mouth daily.   azithromycin 250 MG tablet Commonly known as:  ZITHROMAX Take 1 tablet (250 mg total) by mouth daily for 4 days.    cefdinir 300 MG capsule Commonly known as:  OMNICEF Take 1 capsule (300 mg total) by mouth every 12 (twelve) hours for 4 days.   Eliquis 5 MG Tabs tablet Generic drug:  apixaban Take 5 mg by mouth 2 (two) times daily.   hydrOXYzine 10 MG tablet Commonly known as:  ATARAX/VISTARIL Take 10 mg by mouth at bedtime as needed for sleep.   metoprolol succinate 25 MG 24 hr tablet Commonly known as:  TOPROL-XL Take 1 tablet (25 mg total) by mouth daily.   mometasone-formoterol 100-5 MCG/ACT Aero Commonly known as:  DULERA Inhale 2 puffs into the lungs 2 (two) times daily.   nitroGLYCERIN 0.4 MG SL tablet Commonly known as:  NITROSTAT Place 1 tablet (0.4 mg total) under the tongue every 5 (five) minutes as needed for chest pain.   simvastatin 20 MG tablet Commonly known as:  ZOCOR Take 1 tablet (20 mg total) by mouth daily at 6 PM.   tamsulosin 0.4 MG Caps capsule Commonly known as:  FLOMAX Take 0.4 mg by mouth daily.      Allergies  Allergen Reactions  . Shellfish Allergy Hives   Follow-up Information    PCP. Schedule an appointment as soon as possible for a visit in 2 week(s).            The results of significant diagnostics from this hospitalization (including imaging, microbiology, ancillary and laboratory) are listed below for reference.    Significant Diagnostic Studies: Dg Chest 2 View  Result Date: 09/22/2018 CLINICAL DATA:  Shortness of breath with cough and congestion. History of prostate cancer, myocardial infarction and dementia. EXAM: CHEST - 2 VIEW COMPARISON:  Radiographs 03/19/2018 and 03/24/2017. FINDINGS: There are lower lung volumes with mild elevation of the right hemidiaphragm. The heart size and mediastinal contours are stable. There is mild cardiomegaly and aortic atherosclerosis. There is new airspace opacity medially at the right lung base, projecting inferior to the hilum on the lateral view, and likely in the middle lobe. There is probable mild  atelectasis at both lung bases. There is a stable bleb peripherally at the right lung base. No pneumothorax or significant pleural effusion. The bones appear unchanged. Telemetry leads overlie the chest. IMPRESSION: New right infrahilar airspace opacity, likely in the middle lobe, most consistent with early pneumonia. Followup PA and  lateral chest X-ray is recommended in 3-4 weeks following trial of antibiotic therapy to ensure resolution and exclude underlying malignancy. Electronically Signed   By: Richardean Sale M.D.   On: 09/22/2018 16:58   Dg Abd 1 View  Result Date: 09/23/2018 CLINICAL DATA:  Follow-up small-bowel obstruction. Persistent abdominal distention. EXAM: ABDOMEN - 1 VIEW COMPARISON:  09/22/2018; CT abdomen pelvis-09/22/2018 FINDINGS: Previous identified enteric tube is no longer seen. Re demonstrated moderate gaseous distention of multiple loops of small bowel with index loop of small bowel within the mid hemiabdomen measuring 3.8 cm diameter. This finding is again associated with a relative paucity of distal colonic gas with small amount of air seen within the ascending and transverse colon. Nondiagnostic evaluation for pneumoperitoneum secondary to supine positioning and exclusion of the lower thorax. No pneumatosis or portal venous gas. No definitive abnormal intra-abdominal calcifications. Moderate severe multilevel degenerative change of the lumbar spine is suspected though incompletely evaluated. IMPRESSION: 1. Similar findings worrisome for partial small bowel obstruction. 2. Nonvisualization of enteric tube, potentially excluded from view. Clinical correlation is advised. Electronically Signed   By: Sandi Mariscal M.D.   On: 09/23/2018 08:45   Dg Abdomen 1 View  Result Date: 09/22/2018 CLINICAL DATA:  NG tube placement EXAM: ABDOMEN - 1 VIEW COMPARISON:  None. FINDINGS: The enteric tube terminates over the stomach. There are multiple dilated loops of small bowel. There is no  pneumatosis or free air. IMPRESSION: NG tube projects over the stomach. Electronically Signed   By: Constance Holster M.D.   On: 09/22/2018 22:06   Ct Abdomen Pelvis W Contrast  Result Date: 09/22/2018 CLINICAL DATA:  Vomiting and diarrhea today.  Fever yesterday. EXAM: CT ABDOMEN AND PELVIS WITH CONTRAST TECHNIQUE: Multidetector CT imaging of the abdomen and pelvis was performed using the standard protocol following bolus administration of intravenous contrast. CONTRAST:  193mL OMNIPAQUE IOHEXOL 300 MG/ML  SOLN COMPARISON:  Abdominopelvic CT 03/24/2017. FINDINGS: Lower chest: Advanced emphysematous changes at both lung bases with central airway thickening and fibrosis. Lucency anteriorly at the right costophrenic angle is unchanged, favored to reflect a bleb over a chronic loculated pneumothorax based on stability. No pleural or pericardial effusion. There is aortic and coronary artery atherosclerosis. Hepatobiliary: Multiple hepatic cysts are grossly stable. There are no suspicious liver lesions. There are multiple gallstones. No evidence of gallbladder wall thickening, surrounding inflammation or biliary dilatation. Pancreas: Unremarkable. No pancreatic ductal dilatation or surrounding inflammatory changes. Spleen: Normal in size without focal abnormality. Adrenals/Urinary Tract: Both adrenal glands appear normal. There are stable bilateral renal cysts, largest in the lower pole of the left kidney. No evidence of urinary tract calculus or hydronephrosis. The bladder appears unremarkable. Stomach/Bowel: The stomach and proximal small bowel appear normal. There are multiple mildly dilated loops of small bowel which are fluid-filled with scattered air-fluid levels. There is gradual transition to normal caliber terminal ileum, and no focal transition point is identified. The colon is decompressed. The appendix appears normal. There are mild diverticular changes throughout the distal colon without wall thickening  or surrounding inflammation. Vascular/Lymphatic: There are no enlarged abdominal or pelvic lymph nodes. Stable small lymph nodes in the porta hepatis. There is extensive aortic and branch vessel atherosclerosis. The left external iliac artery is completely occluded over an approximately 7 cm segment. There is distal reconstitution of the left femoral artery. The mesenteric vessels appear patent. Reproductive: Fiducial clips in the prostate gland. Other: There is no ascites, focal extraluminal fluid collection or pneumoperitoneum. No evidence of  abdominal wall hernia. Musculoskeletal: No acute or significant osseous findings. Stable degenerative changes in the lumbar spine associated with a convex right scoliosis. There is a simple lipoma posteriorly in the lower right chest, superficial to the right scapular tip, incompletely visualized. IMPRESSION: 1. Findings suggest an early or partial distal small bowel obstruction, etiology not determined. 2. No evidence bowel perforation, abscess or acute inflammation. The appendix appears normal. 3. No evidence of metastatic prostate cancer. 4. Severe Aortic Atherosclerosis (ICD10-I70.0). 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 Coronary artery atherosclerosis. 5. Additional incidental findings including cholelithiasis, colonic diverticulosis and chronic basilar lung disease. Electronically Signed   By: Richardean Sale M.D.   On: 09/22/2018 18:09    Microbiology: Recent Results (from the past 240 hour(s))  SARS Coronavirus 2 (CEPHEID- Performed in [place] hospital lab), Hosp Order     Status: None   Collection Time: 09/22/18  4:14 PM  Result Value Ref Range Status   SARS Coronavirus 2 NEGATIVE NEGATIVE Final    Comment: (NOTE) If result is NEGATIVE SARS-CoV-2 target nucleic acids are NOT DETECTED. The SARS-CoV-2 RNA is generally detectable in upper and lower  respiratory specimens during the acute phase of infection. The  lowest  concentration of SARS-CoV-2 viral copies this assay can detect is 250  copies / mL. A negative result does not preclude SARS-CoV-2 infection  and should not be used as the sole basis for treatment or other  patient management decisions.  A negative result may occur with  improper specimen collection / handling, submission of specimen other  than nasopharyngeal swab, presence of viral mutation(s) within the  areas targeted by this assay, and inadequate number of viral copies  (<250 copies / mL). A negative result must be combined with clinical  observations, patient history, and epidemiological information. If result is POSITIVE SARS-CoV-2 target nucleic acids are DETECTED. The SARS-CoV-2 RNA is generally detectable in upper and lower  respiratory specimens dur ing the acute phase of infection.  Positive  results are indicative of active infection with SARS-CoV-2.  Clinical  correlation with patient history and other diagnostic information is  necessary to determine patient infection status.  Positive results do  not rule out bacterial infection or co-infection with other viruses. If result is PRESUMPTIVE POSTIVE SARS-CoV-2 nucleic acids MAY BE PRESENT.   A presumptive positive result was obtained on the submitted specimen  and confirmed on repeat testing.  While 2019 novel coronavirus  (SARS-CoV-2) nucleic acids may be present in the submitted sample  additional confirmatory testing may be necessary for epidemiological  and / or clinical management purposes  to differentiate between  SARS-CoV-2 and other Sarbecovirus currently known to infect humans.  If clinically indicated additional testing with an alternate test  methodology [phone]) is advised. The SARS-CoV-2 RNA is generally  detectable in upper and lower respiratory sp ecimens during the acute  phase of infection. The expected result is Negative. Fact Sheet for Patients:   StrictlyIdeas.no Fact Sheet for Healthcare Providers: BankingDealers.co.za This test is not yet approved or cleared by the [place] FDA and has been authorized for detection and/or diagnosis of SARS-CoV-2 by FDA under an Emergency Use Authorization (EUA).  This EUA will remain in effect (meaning this test can be used) for the duration of the COVID-19 declaration under Section 564(b)(1) of the Act, 21 U.S.C. section 360bbb-3(b)(1), unless the authorization is terminated or revoked sooner. Performed at [facility], [place] [address] [place], [place] [address]  Gastrointestinal Panel by PCR , Stool     Status: None   Collection Time: 09/22/18  8:27 PM  Result Value Ref Range Status   Campylobacter species NOT DETECTED NOT DETECTED Final   Plesimonas shigelloides NOT DETECTED NOT DETECTED Final   Salmonella species NOT DETECTED NOT DETECTED Final   Yersinia enterocolitica NOT DETECTED NOT DETECTED Final   Vibrio species NOT DETECTED NOT DETECTED Final   Vibrio cholerae NOT DETECTED NOT DETECTED Final   Enteroaggregative E coli (EAEC) NOT DETECTED NOT DETECTED Final   Enteropathogenic E coli (EPEC) NOT DETECTED NOT DETECTED Final   Enterotoxigenic E coli (ETEC) NOT DETECTED NOT DETECTED Final   Shiga like toxin producing E coli (STEC) NOT DETECTED NOT DETECTED Final   Shigella/Enteroinvasive E coli (EIEC) NOT DETECTED NOT DETECTED Final   Cryptosporidium NOT DETECTED NOT DETECTED Final   Cyclospora cayetanensis NOT DETECTED NOT DETECTED Final   Entamoeba histolytica NOT DETECTED NOT DETECTED Final   Giardia lamblia NOT DETECTED NOT DETECTED Final   Adenovirus F40/41 NOT DETECTED NOT DETECTED Final   Astrovirus NOT DETECTED NOT DETECTED Final   Norovirus GI/GII NOT DETECTED NOT DETECTED Final   Rotavirus A NOT DETECTED NOT DETECTED Final   Sapovirus (I, II, IV, and V) NOT DETECTED NOT DETECTED Final    Comment:  Performed at [facility], [place] [place], [place] [address]  Culture, blood (routine x 2) Call MD if unable to obtain prior to antibiotics being given     Status: None (Preliminary result)   Collection Time: 09/22/18 11:59 PM  Result Value Ref Range Status   Specimen Description   Final    BLOOD RIGHT ANTECUBITAL Performed at [facility], [place] [address] [place], [place] [address]    Special Requests   Final    BOTTLES DRAWN AEROBIC ONLY Blood Culture adequate volume Performed at [facility] [address] [place], [place] [address]    Culture   Final    NO GROWTH 3 DAYS Performed at [facility], [place] [address], [place], [place] [address]    Report Status PENDING  Incomplete  Culture, blood (routine x 2) Call MD if unable to obtain prior to antibiotics being given     Status: Abnormal   Collection Time: 09/22/18 11:59 PM  Result Value Ref Range Status   Specimen Description   Final    BLOOD BLOOD RIGHT HAND Performed at [facility] [address], [place], [place] [address]    Special Requests   Final    BOTTLES DRAWN AEROBIC ONLY Blood Culture adequate volume Performed at [facility] [address] [place], [place] [address]    Culture  Setup Time   Final    GRAM POSITIVE COCCI IN CLUSTERS AEROBIC BOTTLE ONLY CRITICAL RESULT CALLED TO, READ BACK BY AND VERIFIED WITH: [place] [id number] AT 50 AM BY CM    Culture (A)  Final    STAPHYLOCOCCUS SPECIES (COAGULASE NEGATIVE) THE SIGNIFICANCE OF ISOLATING THIS ORGANISM FROM A SINGLE SET OF BLOOD CULTURES WHEN MULTIPLE SETS ARE DRAWN IS UNCERTAIN. PLEASE NOTIFY THE MICROBIOLOGY DEPARTMENT WITHIN ONE WEEK IF SPECIATION AND SENSITIVITIES ARE REQUIRED. Performed at [facility], [place] [address] [place], [place] [address]    Report Status 09/26/2018 FINAL  Final  Blood Culture ID Panel (Reflexed)     Status:  Abnormal   Collection Time: 09/22/18 11:59 PM  Result Value Ref Range Status  Enterococcus species NOT DETECTED NOT DETECTED Final   Listeria monocytogenes NOT DETECTED NOT DETECTED Final   Staphylococcus species DETECTED (A) NOT DETECTED Final    Comment: Methicillin (oxacillin) susceptible coagulase negative staphylococcus. Possible blood culture contaminant (unless isolated from more than one blood culture draw or clinical case suggests pathogenicity). No antibiotic treatment is indicated for blood  culture contaminants. CRITICAL RESULT CALLED TO, READ BACK BY AND VERIFIED WITH: PHARMD M [place] [id number] AT 23 AM BY CM    Staphylococcus aureus (BCID) NOT DETECTED NOT DETECTED Final   Methicillin resistance NOT DETECTED NOT DETECTED Final   Streptococcus species NOT DETECTED NOT DETECTED Final   Streptococcus agalactiae NOT DETECTED NOT DETECTED Final   Streptococcus pneumoniae NOT DETECTED NOT DETECTED Final   Streptococcus pyogenes NOT DETECTED NOT DETECTED Final   Acinetobacter baumannii NOT DETECTED NOT DETECTED Final   Enterobacteriaceae species NOT DETECTED NOT DETECTED Final   Enterobacter cloacae complex NOT DETECTED NOT DETECTED Final   Escherichia coli NOT DETECTED NOT DETECTED Final   Klebsiella oxytoca NOT DETECTED NOT DETECTED Final   Klebsiella pneumoniae NOT DETECTED NOT DETECTED Final   Proteus species NOT DETECTED NOT DETECTED Final   Serratia marcescens NOT DETECTED NOT DETECTED Final   Haemophilus influenzae NOT DETECTED NOT DETECTED Final   Neisseria meningitidis NOT DETECTED NOT DETECTED Final   Pseudomonas aeruginosa NOT DETECTED NOT DETECTED Final   Candida albicans NOT DETECTED NOT DETECTED Final   Candida glabrata NOT DETECTED NOT DETECTED Final   Candida krusei NOT DETECTED NOT DETECTED Final   Candida parapsilosis NOT DETECTED NOT DETECTED Final   Candida tropicalis NOT DETECTED NOT DETECTED Final    Comment: Performed at [facility], [address], [place], [place] [address]  Culture, sputum-assessment     Status: None   Collection Time: 09/23/18  4:30 PM  Result Value Ref Range Status   Specimen Description SPUTUM  Final   Special Requests Normal  Final   Sputum evaluation   Final    THIS SPECIMEN IS ACCEPTABLE FOR SPUTUM CULTURE Performed at [facility], [place] [address] [place], [place] [address]    Report Status 09/23/2018 FINAL  Final  Culture, respiratory     Status: None   Collection Time: 09/23/18  4:30 PM  Result Value Ref Range Status   Specimen Description   Final    SPUTUM Performed at [facility] [address] [place], [place] [address]    Special Requests   Final    Normal Reflexed from [id number] Performed at [facility], [place] [address], [place], [place] [address]    Gram Stain   Final    RARE WBC PRESENT, PREDOMINANTLY PMN MODERATE SQUAMOUS EPITHELIAL CELLS PRESENT ABUNDANT GRAM POSITIVE COCCI ABUNDANT GRAM NEGATIVE RODS ABUNDANT GRAM POSITIVE RODS RARE YEAST    Culture   Final    Consistent with normal respiratory flora. Performed at [facility], [place] [address], [place], [place] [address]    Report Status 09/25/2018 FINAL  Final     Labs: Basic Metabolic Panel: Recent Labs  Lab 09/22/18 1613 09/22/18 2118 09/23/18 0659 09/24/18 0503 09/25/18 0532 09/26/18 0542  NA 142  --  141 143 141 136  K 3.6  --  3.4* 3.9 3.7 3.5  CL 110  --  113* 115* 119* 110  CO2 22  --  22 19* 17* 20*  GLUCOSE 119*  --  105* 90 88 87  BUN  15  --  11 12 13 10   CREATININE 1.18  --  1.01 1.04 0.90 1.03  CALCIUM 9.3  --  8.8* 9.0 8.2* 8.7*  MG  --  1.9 2.2  --  2.1  --   PHOS  --   --  2.3*  --   --   --    Liver Function Tests: Recent Labs  Lab 09/22/18 1613 09/23/18 0659 09/24/18 0503 09/25/18 0532 09/26/18 0542  AST 18 19 23 17 19   ALT 11 11 12 11 11   ALKPHOS 84 64 60 50 60  BILITOT 1.3* 1.0 1.1 0.7 0.8  PROT 7.7 6.9 6.7 5.5* 6.3*   ALBUMIN 4.2 3.7 3.7 3.1* 3.7   Recent Labs  Lab 09/22/18 1613  LIPASE 19   No results for input(s): AMMONIA in the last 168 hours. CBC: Recent Labs  Lab 09/22/18 1613 09/23/18 0659 09/24/18 0503 09/25/18 0532  WBC 16.8* 13.0* 11.3* 8.3  NEUTROABS 15.0*  --   --   --   HGB 16.1 15.3 16.2 13.9  HCT 50.0 47.5 48.1 42.3  MCV 96.9 98.1 97.6 98.4  PLT 208 205 198 199   Cardiac Enzymes: Recent Labs  Lab 09/22/18 2118 09/22/18 2359 09/23/18 0659  TROPONINI 0.04* 0.04* 0.04*   BNP: BNP (last 3 results) No results for input(s): BNP in the last 8760 hours.  ProBNP (last 3 results) No results for input(s): PROBNP in the last 8760 hours.  CBG: No results for input(s): GLUCAP in the last 168 hours.     Signed:  Irine Seal MD.  [facility] 09/26/2018, 2:23 PM

## 2018-09-26 NOTE — Progress Notes (Signed)
Pt discharged home today per MD. Pt's IV site D/C'd and WDL. Pt's VSS. Pt's wife provided with home medication list, discharge instructions and prescriptions. Verbalized understanding. Pt left floor via WC in stable condition accompanied by NT.

## 2018-09-26 NOTE — Discharge Instructions (Signed)
Formoterol; Mometasone metered dose inhaler What is this medicine? FORMOTEROL; MOMETASONE (for [place] te rol; moe MET a sone) inhalation is a combination of two medicines that decrease inflammation and help to open up the airways in your lungs. It is used to treat asthma. Do NOT use in an acute asthma attack. This medicine may be used for other purposes; ask your health care provider or pharmacist if you have questions. COMMON BRAND NAME(S): Dulera What should I tell my health care provider before I take this medicine? They need to know if you have any of these conditions: -bone problems -diabetes -glaucoma -heart disease or irregular heartbeat -high blood pressure -immune system problems -infection, like chickenpox, tuberculosis, herpes, or fungal infection -pheochromocytoma -recent surgery or injury of the mouth or throat -seizures -taking corticosteroids by mouth -thyroid disease -worsening asthma -an unusual or allergic reaction to formoterol, mometasone, steroids, other medicines, foods, dyes, or preservatives -pregnant or trying to get pregnant -breast-feeding -breast-feeding How should I use this medicine? This medicine is inhaled through the mouth. Follow the directions on the prescription label. Rinse your mouth with water after use. Make sure not to swallow the water. Take your medicine at regular intervals. Do not take your medicine more often than directed. Do not stop taking except on your doctor's advice. Make sure that you are using your inhaler correctly. Ask your doctor or health care provider if you have any questions. A special MedGuide will be given to you by the pharmacist with each prescription and refill. Be sure to read this information carefully each time. Talk to your pediatrician regarding the use of this medicine in children. While this drug may be prescribed for children as young as 5 years for selected conditions, precautions do apply. Overdosage: If you think  you have taken too much of this medicine contact a poison control center or emergency room at once. NOTE: This medicine is only for you. Do not share this medicine with others. What if I miss a dose? If you miss a dose, use it as soon as you remember. If it is almost time for your next dose, use only that dose and continue with your regular schedule, spacing doses evenly. Do not use double or extra doses. What may interact with this medicine? Do not take this medicine with any of the following mediations: -MAOIs like Carbex, Eldepryl, Marplan, Nardil, and Parnate This medicine may also interact with the following medications: -aminophylline or theophylline -antiviral medicines for HIV or AIDS -certain antibiotics like clarithromycin, linezolid, and telithromycin -certain medicines for blood pressure, heart disease, or irregular heart beat -certain medicines for colds -certain medicines for depression or emotional conditions -certain medicines for fungal infections like ketoconazole and itraconazole -cobicistat -diuretics -other medicines for breathing problems This list may not describe all possible interactions. Give your health care provider a list of all the medicines, herbs, non-prescription drugs, or dietary supplements you use. Also tell them if you smoke, drink alcohol, or use illegal drugs. Some items may interact with your medicine. What should I watch for while using this medicine? Visit your doctor for regular check ups. Tell your doctor or health care professional if your symptoms do not get better. Do not use this medicine more than every 12 hours. NEVER use this medicine for an acute asthma attack. You should use your short-acting rescue inhalers for this purpose. If your symptoms get worse or if you need your short-acting inhalers more often, call your doctor right away. This medicine may increase  your risk of getting an infection. Tell your doctor or health care professional if  you are around anyone with measles or chickenpox, or if you develop sores or blisters that do not heal properly. What side effects may I notice from receiving this medicine? Side effects that you should report to your doctor or health care professional as soon as possible: -allergic reactions like skin rash or hives, swelling of the face, lips, or tongue -breathing problems -changes in vision -chest pain -dizziness -fast, irregular heartbeat -feeling faint or lightheaded, falls -fever or chills -high blood pressure -infection -nausea, vomiting -tremors -unusually weak or tired -white patches or sores in the mouth or throat Side effects that usually do not require medical attention (report to your doctor or health care professional if they continue or are bothersome): -changes in taste -coughing, hoarseness or throat irritation -dry mouth -headache -muscle pain -stomach upset This list may not describe all possible side effects. Call your doctor for medical advice about side effects. You may report side effects to FDA at 1-800-FDA-1088. Where should I keep my medicine? Keep out of the reach of children. Store at room temperature between 59 and 86 degrees F (15 and 30 degrees C). Throw away the inhaler after the dose counter reaches 0 or after the expiration date, whichever comes first. Avoid exposure to heat, fire, and flame. NOTE: This sheet is a summary. It may not cover all possible information. If you have questions about this medicine, talk to your doctor, pharmacist, or health care provider.  2019 Elsevier/Gold Standard (2017-12-10 18:10:49) Cefdinir capsules What is this medicine? CEFDINIR (SEF di ner) is a cephalosporin antibiotic. It is used to treat certain kinds of bacterial infections. It will not work for colds, flu, or other viral infections. This medicine may be used for other purposes; ask your health care provider or pharmacist if you have questions. COMMON BRAND  NAME(S): Omnicef What should I tell my health care provider before I take this medicine? They need to know if you have any of these conditions: -bleeding problems -kidney disease -stomach or intestine problems (especially colitis) -an unusual or allergic reaction to cefdinir, other cephalosporin antibiotics, penicillin, penicillamine, other foods, dyes or preservatives -pregnant or trying to get pregnant -breast-feeding How should I use this medicine? Take this medicine by mouth. Swallow it with a drink of water. Follow the directions on the prescription label. You can take it with or without food. If it upsets your stomach it may help to take it with food. Take your doses at regular intervals. Do not take it more often than directed. Finish all the medicine you are prescribed even if you think your infection is better. Talk to your pediatrician regarding the use of this medicine in children. Special care may be needed. Overdosage: If you think you have taken too much of this medicine contact a poison control center or emergency room at once. NOTE: This medicine is only for you. Do not share this medicine with others. What if I miss a dose? If you miss a dose, take it as soon as you can. If it is almost time for your next dose, take only that dose. Do not take double or extra doses. What may interact with this medicine? -antacids that contain aluminum or magnesium -iron supplements -other antibiotics -probenecid This list may not describe all possible interactions. Give your health care provider a list of all the medicines, herbs, non-prescription drugs, or dietary supplements you use. Also tell them if you  smoke, drink alcohol, or use illegal drugs. Some items may interact with your medicine. What should I watch for while using this medicine? Tell your doctor or health care professional if your symptoms do not get better in a few days. If you are diabetic you may get a false-positive result  for sugar in your urine. Check with your doctor or health care professional before you change your diet or the dose of your diabetes medicine. What side effects may I notice from receiving this medicine? Side effects that you should report to your doctor or health care professional as soon as possible: -allergic reactions like skin rash, itching or hives, swelling of the face, lips, or tongue -bloody or watery diarrhea -breathing problems -fever -redness, blistering, peeling or loosening of the skin, including inside the mouth -seizures -trouble passing urine or change in the amount of urine -unusual bleeding or bruising -unusually weak or tired Side effects that usually do not require medical attention (report to your doctor or health care professional if they continue or are bothersome): -constipation -diarrhea -dizziness -dry mouth -headache -loss of appetite -nausea, vomiting -stomach pain -stool discoloration -tiredness -vaginal discharge, itching, or odor in women This list may not describe all possible side effects. Call your doctor for medical advice about side effects. You may report side effects to FDA at 1-800-FDA-1088. Where should I keep my medicine? Keep out of the reach of children. Store at room temperature between 15 and 30 degrees C (59 and 86 degrees F). Throw the medicine away after the expiration date. NOTE: This sheet is a summary. It may not cover all possible information. If you have questions about this medicine, talk to your doctor, pharmacist, or health care provider.  2019 Elsevier/Gold Standard (2015-08-22 15:52:44) Azithromycin tablets What is this medicine? AZITHROMYCIN (az ith roe MYE sin) is a macrolide antibiotic. It is used to treat or prevent certain kinds of bacterial infections. It will not work for colds, flu, or other viral infections. This medicine may be used for other purposes; ask your health care provider or pharmacist if you have  questions. COMMON BRAND NAME(S): Zithromax, Zithromax Tri-Pak, Zithromax Z-Pak What should I tell my health care provider before I take this medicine? They need to know if you have any of these conditions: -history of blood diseases, like leukemia -history of irregular heartbeat -kidney disease -liver disease -myasthenia gravis -an unusual or allergic reaction to azithromycin, erythromycin, other macrolide antibiotics, foods, dyes, or preservatives -pregnant or trying to get pregnant -breast-feeding How should I use this medicine? Take this medicine by mouth with a full glass of water. Follow the directions on the prescription label. The tablets can be taken with food or on an empty stomach. If the medicine upsets your stomach, take it with food. Take your medicine at regular intervals. Do not take your medicine more often than directed. Take all of your medicine as directed even if you think your are better. Do not skip doses or stop your medicine early. Talk to your pediatrician regarding the use of this medicine in children. While this drug may be prescribed for children as young as 6 months for selected conditions, precautions do apply. Overdosage: If you think you have taken too much of this medicine contact a poison control center or emergency room at once. NOTE: This medicine is only for you. Do not share this medicine with others. What if I miss a dose? If you miss a dose, take it as soon as you can. If  it is almost time for your next dose, take only that dose. Do not take double or extra doses. What may interact with this medicine? Do not take this medicine with any of the following medications: -cisapride -dronedarone -pimozide -thioridazine This medicine may also interact with the following medications: -antacids that contain aluminum or magnesium -birth control pills -certain medicines for irregular heart beat like amiodarone, dofetilide, encainide, flecainide, propafenone,  quinidine -colchicine -cyclosporine -digoxin -nelfinavir -phenytoin -warfarin This list may not describe all possible interactions. Give your health care provider a list of all the medicines, herbs, non-prescription drugs, or dietary supplements you use. Also tell them if you smoke, drink alcohol, or use illegal drugs. Some items may interact with your medicine. What should I watch for while using this medicine? Tell your doctor or healthcare professional if your symptoms do not start to get better or if they get worse. Do not treat diarrhea with over the counter products. Contact your doctor if you have diarrhea that lasts more than 2 days or if it is severe and watery. This medicine can make you more sensitive to the sun. Keep out of the sun. If you cannot avoid being in the sun, wear protective clothing and use sunscreen. Do not use sun lamps or tanning beds/booths. What side effects may I notice from receiving this medicine? Side effects that you should report to your doctor or health care professional as soon as possible: -allergic reactions like skin rash, itching or hives, swelling of the face, lips, or tongue -bloody or watery diarrhea -breathing problems -chest pain -fast, irregular heartbeat -muscle weakness -redness, blistering, peeling or loosening of the skin, including inside the mouth -signs and symptoms of liver injury like dark yellow or brown urine; general ill feeling or flu-like symptoms; light-colored stools; loss of appetite; nausea; right upper belly pain; unusually weak or tired; yellowing of the eyes or skin -white patches or sores in the mouth -unusually weak or tired Side effects that usually do not require medical attention (report to your doctor or health care professional if they continue or are bothersome): -diarrhea -nausea -stomach pain -vomiting This list may not describe all possible side effects. Call your doctor for medical advice about side effects.  You may report side effects to FDA at 1-800-FDA-1088. Where should I keep my medicine? Keep out of the reach of children. Store at room temperature between 15 and 30 degrees C (59 and 86 degrees F). Throw away any unused medicine after the expiration date. NOTE: This sheet is a summary. It may not cover all possible information. If you have questions about this medicine, talk to your doctor, pharmacist, or health care provider.  2019 Elsevier/Gold Standard (2017-08-23 15:21:46)

## 2018-09-28 LAB — CULTURE, BLOOD (ROUTINE X 2)
Culture: NO GROWTH
Special Requests: ADEQUATE

## 2018-10-26 DIAGNOSIS — C61 Malignant neoplasm of prostate: Secondary | ICD-10-CM | POA: Insufficient documentation

## 2019-04-06 ENCOUNTER — Other Ambulatory Visit: Payer: Self-pay

## 2019-04-06 NOTE — Patient Outreach (Signed)
[facility] Evaluation Interviewer made contact with patients spouse. Patient unable to complete survey at this time.  Aging Gracefully survey scheduled for 04/08/19.   [facility] Management Assistant

## 2019-04-08 ENCOUNTER — Other Ambulatory Visit: Payer: Self-pay

## 2019-04-08 ENCOUNTER — Ambulatory Visit: Payer: Self-pay

## 2019-04-08 NOTE — Patient Outreach (Signed)
[facility] Evaluation Interviewer made contact with patient. Aging Gracefully survey completed.   Interviewer will send referral to OT and Laine Tousey, RN for follow up.   Charles Chan Care Management Assistant 

## 2019-04-09 ENCOUNTER — Other Ambulatory Visit: Payer: Self-pay | Admitting: Specialist

## 2019-04-09 ENCOUNTER — Other Ambulatory Visit: Payer: Self-pay

## 2019-05-01 ENCOUNTER — Other Ambulatory Visit: Payer: Self-pay | Admitting: Family Medicine

## 2019-05-01 DIAGNOSIS — I10 Essential (primary) hypertension: Secondary | ICD-10-CM

## 2019-05-12 ENCOUNTER — Other Ambulatory Visit: Payer: Self-pay | Admitting: Family Medicine

## 2019-05-12 DIAGNOSIS — I10 Essential (primary) hypertension: Secondary | ICD-10-CM

## 2019-06-01 DIAGNOSIS — F02818 Dementia in other diseases classified elsewhere, unspecified severity, with other behavioral disturbance: Secondary | ICD-10-CM | POA: Insufficient documentation

## 2019-07-30 DIAGNOSIS — J849 Interstitial pulmonary disease, unspecified: Secondary | ICD-10-CM | POA: Insufficient documentation

## 2019-08-27 ENCOUNTER — Encounter: Payer: Self-pay | Admitting: Podiatry

## 2019-08-27 ENCOUNTER — Ambulatory Visit: Payer: Medicare Other | Admitting: Podiatry

## 2019-08-27 ENCOUNTER — Other Ambulatory Visit: Payer: Self-pay

## 2019-08-27 DIAGNOSIS — B351 Tinea unguium: Secondary | ICD-10-CM | POA: Diagnosis not present

## 2019-08-27 DIAGNOSIS — L600 Ingrowing nail: Secondary | ICD-10-CM

## 2019-08-27 DIAGNOSIS — M79674 Pain in right toe(s): Secondary | ICD-10-CM | POA: Diagnosis not present

## 2019-08-27 DIAGNOSIS — M79675 Pain in left toe(s): Secondary | ICD-10-CM | POA: Diagnosis not present

## 2019-08-27 NOTE — Patient Instructions (Signed)

## 2019-08-27 NOTE — Progress Notes (Signed)
Subjective:   Patient ID: Charles Chan, male   DOB: 79 y.o.   MRN: [id number]   HPI Patient presents with caregiver stating that his left big toenail has been coming off and patient cannot take care of it.  Also concerned about all nails being thickened and bothersome and he like to get them taken care of.  He states he is not sure whether he may have had some trauma to this big toe   ROS      Objective:  Physical Exam  Neurovascular status intact with patient found to have an incurvated left hallux nail that is loose and it is moderately painful with slight drainage in the plantar surface.  Also noted to have nail disease of all remaining nails that are incurvated and sore for him     Assessment:  Damage left hallux nail with ingrown component along with mycotic nail infection 1 through 5 right 2 through 5 left     Plan:  H&P reviewed conditions discussed ingrown toenail and the fact we may need to do a permanent procedure at 1 point in future and I educated him on damage to his nail plate and the fact it may not grow normally.  Today I went ahead I infiltrated 60 mg like Marcaine mixture sterile prep.I remove the nail flushed out the bed applied sterile dressing and I debrided all remaining nails with no iatrogenic bleeding and reappoint for routine care or earlier if any issues were to occur including to the possibility of permanent procedure

## 2019-09-04 DIAGNOSIS — K922 Gastrointestinal hemorrhage, unspecified: Secondary | ICD-10-CM | POA: Insufficient documentation

## 2019-09-12 IMAGING — CR DG CHEST 2V
2 series · 2 of 2 positions shown · non-contrast
Comparison: None.

CLINICAL DATA: 76-year-old male with right lower quadrant pain for
2 days.

EXAM:
CHEST  2 VIEW

[w chest pa]
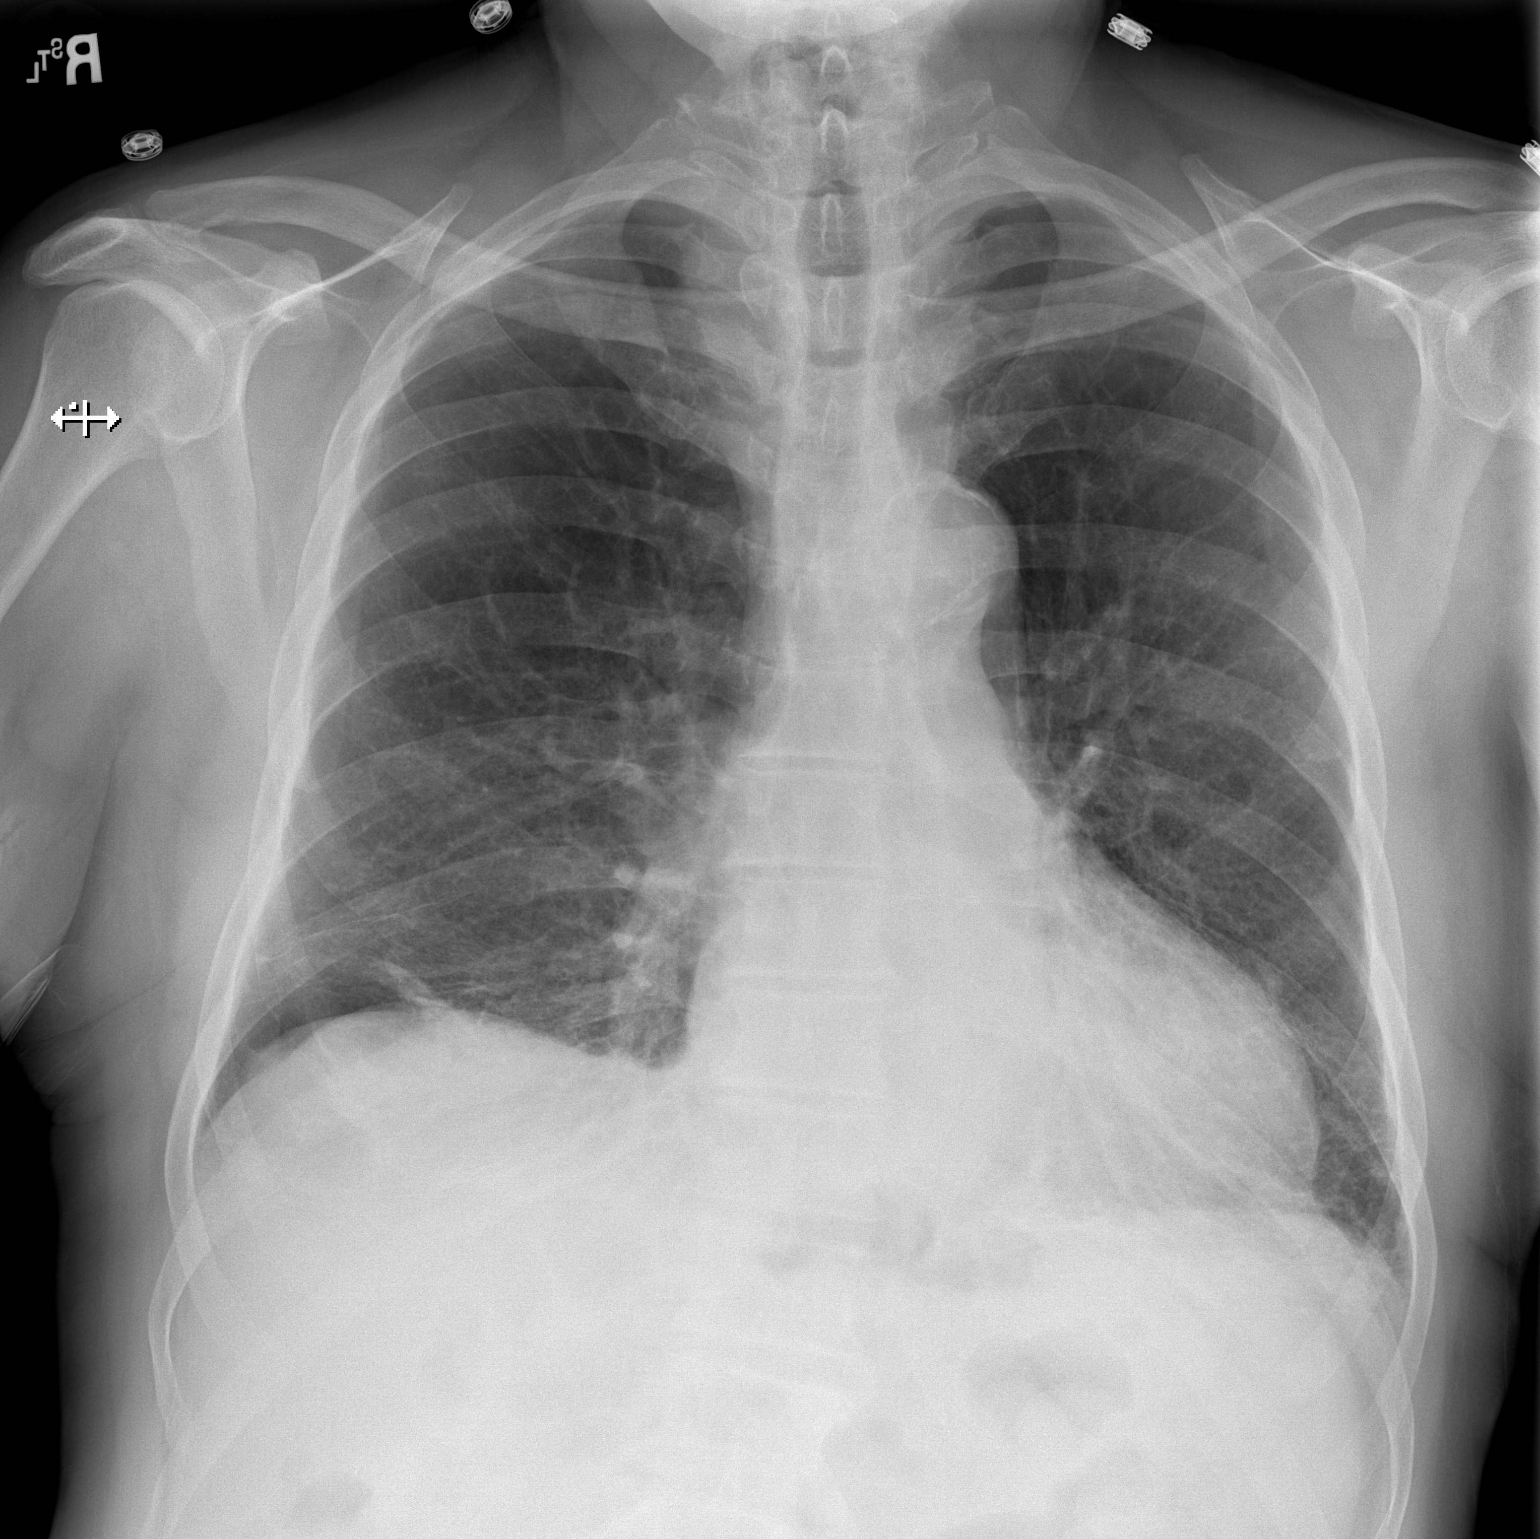

[w chest lat]
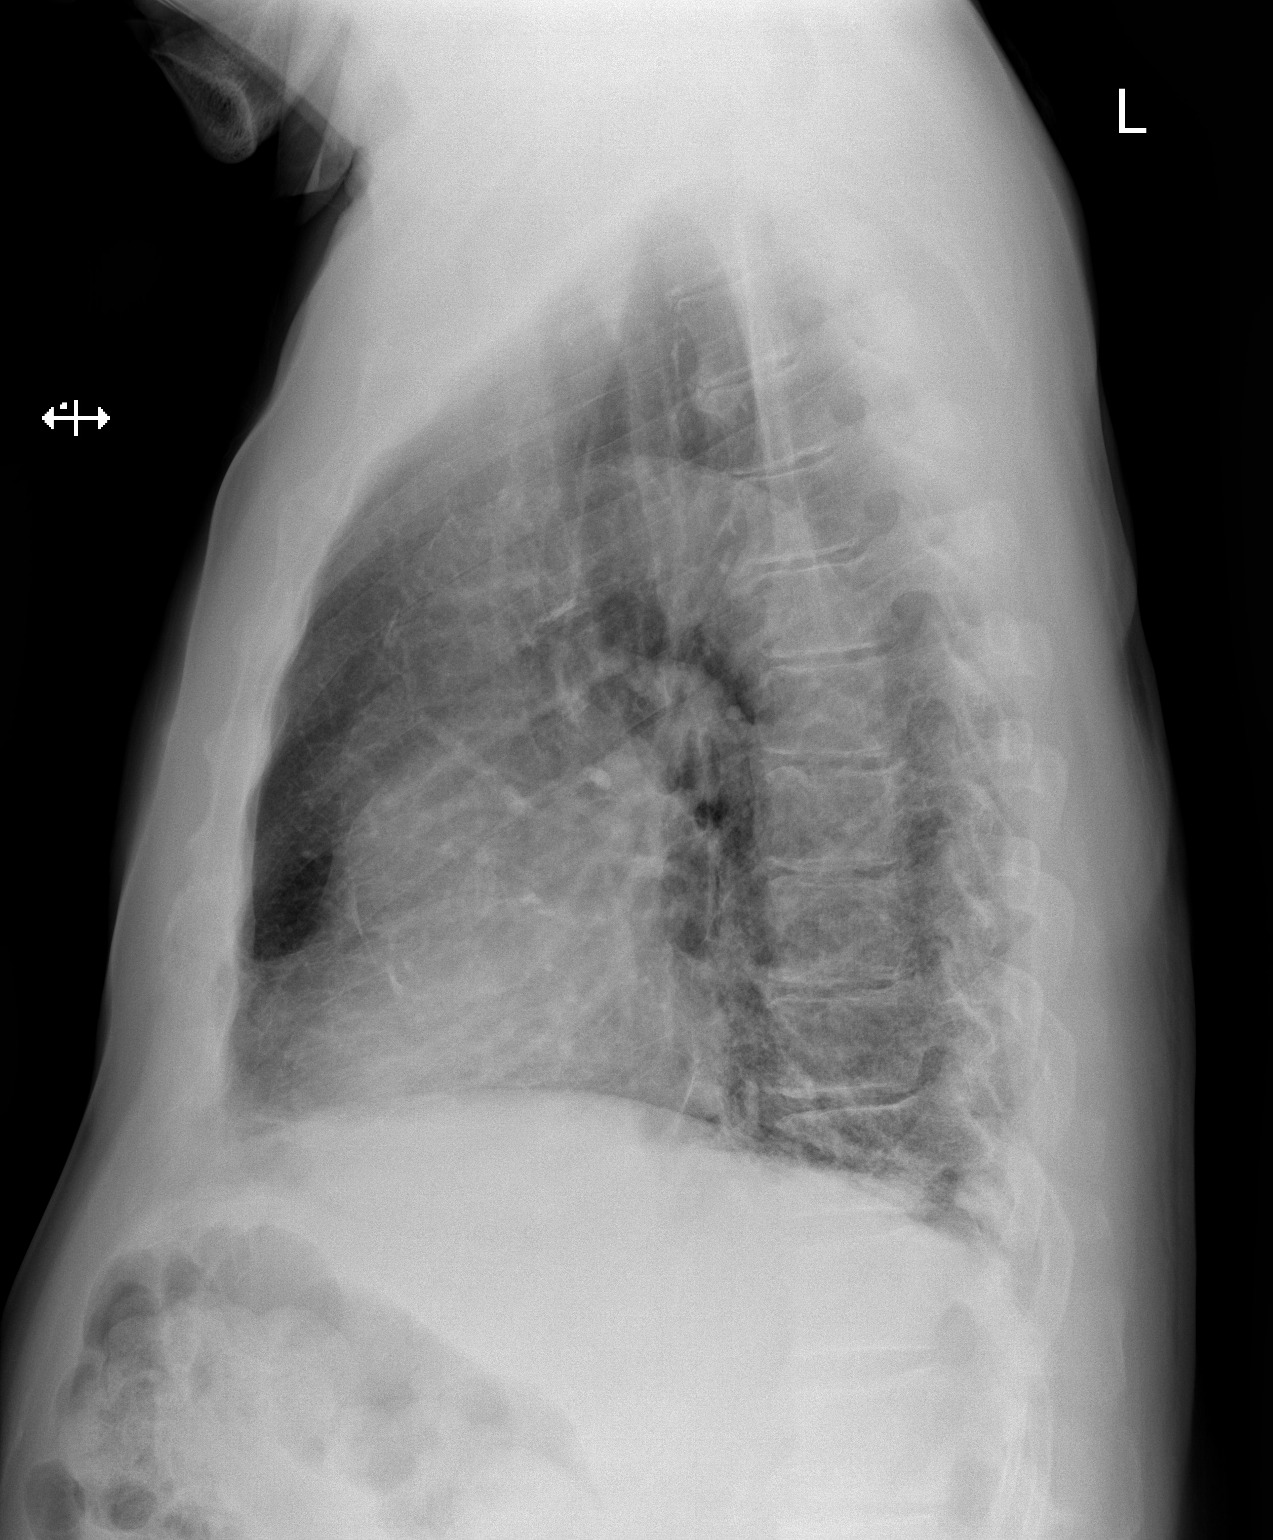

[2 of 2 positions shown; findings below may reference images not displayed]

FINDINGS: Cardiomediastinal silhouette is normal in size and configuration.

There are mild changes of COPD with prominence of the interstitium
and bibasilar atelectasis. No focal parenchymal opacity, pleural
effusion or pneumothorax.

No acute osseous abnormality.
IMPRESSION: Chronic emphysematous/ COPD changes with bibasilar atelectasis. No
acute intrathoracic process.

## 2019-10-25 DIAGNOSIS — N471 Phimosis: Secondary | ICD-10-CM | POA: Insufficient documentation

## 2020-04-28 ENCOUNTER — Ambulatory Visit: Payer: Medicare Other | Admitting: Podiatry

## 2020-04-28 ENCOUNTER — Encounter: Payer: Self-pay | Admitting: Podiatry

## 2020-04-28 ENCOUNTER — Other Ambulatory Visit: Payer: Self-pay

## 2020-04-28 DIAGNOSIS — B351 Tinea unguium: Secondary | ICD-10-CM

## 2020-04-28 DIAGNOSIS — M79675 Pain in left toe(s): Secondary | ICD-10-CM

## 2020-04-28 DIAGNOSIS — M79674 Pain in right toe(s): Secondary | ICD-10-CM | POA: Diagnosis not present

## 2020-04-28 NOTE — Progress Notes (Signed)
Subjective:   Patient ID: Charles Chan, male   DOB: 79 y.o.   MRN: [id number]   HPI Patient presents with painful nailbeds 1-5 both feet that they cannot cut and is with caregiver today   ROS      Objective:  Physical Exam  Neurovascular status intact with thick incurvated nailbeds that do grow intake into the distal tissue of the toes 1 through 5 both feet     Assessment:  Chronic mycotic nail infection with pain 1-5 both feet     Plan:  Debridement painful nailbeds 1-5 both feet no iatrogenic bleeding reappoint for routine care 3 months

## 2020-10-07 ENCOUNTER — Other Ambulatory Visit: Payer: Self-pay

## 2020-10-07 ENCOUNTER — Ambulatory Visit: Payer: Medicare Other | Admitting: Podiatry

## 2020-10-07 ENCOUNTER — Encounter: Payer: Self-pay | Admitting: Podiatry

## 2020-10-07 DIAGNOSIS — L600 Ingrowing nail: Secondary | ICD-10-CM

## 2020-10-07 DIAGNOSIS — B351 Tinea unguium: Secondary | ICD-10-CM | POA: Diagnosis not present

## 2020-10-07 DIAGNOSIS — D689 Coagulation defect, unspecified: Secondary | ICD-10-CM | POA: Diagnosis not present

## 2020-10-07 DIAGNOSIS — M79675 Pain in left toe(s): Secondary | ICD-10-CM | POA: Diagnosis not present

## 2020-10-07 DIAGNOSIS — M79674 Pain in right toe(s): Secondary | ICD-10-CM

## 2020-10-07 NOTE — Progress Notes (Signed)
This patient returns to my office for at risk foot care.  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 This patient is unable to cut nails himself since the patient cannot reach his nails.These nails are painful walking and wearing shoes.  This patient presents for at risk foot care today.  General Appearance  Alert, conversant and in no acute stress.  Vascular  Dorsalis pedis and posterior tibial  pulses are palpable  bilaterally.  Capillary return is within normal limits  bilaterally. Temperature is within normal limits  bilaterally.  Neurologic  Senn-Weinstein monofilament wire test within normal limits  bilaterally. Muscle power within normal limits bilaterally.  Nails Thick disfigured discolored nails with subungual debris  from hallux to fifth toes bilaterally. No evidence of bacterial infection or drainage bilaterally.  Orthopedic  No limitations of motion  feet .  No crepitus or effusions noted.  Hammer toes/mallet toes  2-5  B/L  Skin  normotropic skin with no porokeratosis noted bilaterally.  No signs of infections or ulcers noted.     Onychomycosis  Pain in right toes  Pain in left toes  Consent was obtained for treatment procedures.   Mechanical debridement of nails 1-5  bilaterally performed with a nail nipper.  Filed with dremel without incident.    Return office visit   4 months                   Told patient to return for periodic foot care and evaluation due to potential at risk complications.   Gardiner Barefoot DPM

## 2021-01-30 ENCOUNTER — Ambulatory Visit: Payer: Medicare Other | Admitting: Podiatry

## 2021-02-17 ENCOUNTER — Emergency Department (HOSPITAL_COMMUNITY): Payer: Medicare Other

## 2021-02-17 ENCOUNTER — Encounter (HOSPITAL_COMMUNITY): Payer: Self-pay

## 2021-02-17 ENCOUNTER — Other Ambulatory Visit: Payer: Self-pay

## 2021-02-17 ENCOUNTER — Emergency Department (HOSPITAL_COMMUNITY)
Admission: EM | Admit: 2021-02-17 | Discharge: 2021-02-17 | Disposition: A | Discharge status: Home / Self Care | Payer: Medicare Other | Attending: Emergency Medicine | Admitting: Emergency Medicine

## 2021-02-17 DIAGNOSIS — I2510 Atherosclerotic heart disease of native coronary artery without angina pectoris: Secondary | ICD-10-CM | POA: Diagnosis not present

## 2021-02-17 DIAGNOSIS — F0280 Dementia in other diseases classified elsewhere without behavioral disturbance: Secondary | ICD-10-CM | POA: Diagnosis not present

## 2021-02-17 DIAGNOSIS — Z7982 Long term (current) use of aspirin: Secondary | ICD-10-CM | POA: Diagnosis not present

## 2021-02-17 DIAGNOSIS — J449 Chronic obstructive pulmonary disease, unspecified: Secondary | ICD-10-CM | POA: Diagnosis not present

## 2021-02-17 DIAGNOSIS — G309 Alzheimer's disease, unspecified: Secondary | ICD-10-CM | POA: Insufficient documentation

## 2021-02-17 DIAGNOSIS — I509 Heart failure, unspecified: Secondary | ICD-10-CM | POA: Diagnosis not present

## 2021-02-17 DIAGNOSIS — I110 Hypertensive heart disease with heart failure: Secondary | ICD-10-CM | POA: Insufficient documentation

## 2021-02-17 DIAGNOSIS — Z79899 Other long term (current) drug therapy: Secondary | ICD-10-CM | POA: Insufficient documentation

## 2021-02-17 DIAGNOSIS — W19XXXA Unspecified fall, initial encounter: Secondary | ICD-10-CM | POA: Insufficient documentation

## 2021-02-17 DIAGNOSIS — Z7901 Long term (current) use of anticoagulants: Secondary | ICD-10-CM | POA: Diagnosis not present

## 2021-02-17 DIAGNOSIS — Z8546 Personal history of malignant neoplasm of prostate: Secondary | ICD-10-CM | POA: Insufficient documentation

## 2021-02-17 DIAGNOSIS — Z87891 Personal history of nicotine dependence: Secondary | ICD-10-CM | POA: Diagnosis not present

## 2021-02-17 NOTE — ED Notes (Signed)
Attempted to call report to [facility], nurse unavailable, facility will call back

## 2021-02-17 NOTE — ED Notes (Signed)
Pt tired to get out of bed three times with me. I helped pt pee twice since being with pt.

## 2021-02-17 NOTE — ED Provider Notes (Addendum)
[facility] EMERGENCY DEPARTMENT Provider Note   CSN: [id number] Arrival date & time: 02/17/21  4235     History Chief Complaint  Patient presents with   Charles Chan is a 80 y.o. male.  HPI Level 5 caveat secondary to dementia History obtained chiefly via EMS from memory care facility Additional history and care discussed with patient's wife 24 year old man history of dementia, coronary artery disease, was just admitted to memory care unit yesterday.  He presents to the ED with reports that he was found on the ground with concerns for fall.  Patient has no complaints.  He denies having any pain anywhere.  He denies any headache, vision changes, chest pain, shortness of breath, abdominal pain, nausea, vomiting, or diarrhea. His wife reports that he is supposed to walk with a walker.  She states he has had multiple recent falls.  She was contacted by the nursing home and with report that he had fallen and was being transported to the hospital.  They were unable to tell her which hospital he was being transported to.  EMS transported here from [facility] with report of unwitnessed fall and reports that patient was at baseline.  Cervical collar was placed prior to transport.  No injuries were noted.     Past Medical History:  Diagnosis Date   Ambulates with cane 11/22/2016   In the 90s, patient injured his knee (ligament tear?) and declined surgery. Since then he has been using a cane to ambulate. Reports he is not in pain.    CAD (coronary artery disease) 11/22/2016   With Stent in 1998   CHF (congestive heart failure) ([place]) 11/22/2016   Diagnosed in 1998   Dementia ([place]) 11/23/2016   Former smoker 11/22/2016   Quit 1980s   H/O partial resection of colon 11/22/2016   Due to polyp in 2004    History of alcohol abuse 11/22/2016   1970s   History of MI (myocardial infarction) 11/22/2016   2001   History of prostate cancer 11/22/2016   S/p radiation in  2015? Was followed by previous oncologist yearly (Dr. Nona Dell in [place])    History of stroke 11/22/2016   HLD (hyperlipidemia) 11/22/2016   HTN (hypertension) 11/22/2016   Irregular cardiac rhythm 11/22/2016   OSA (obstructive sleep apnea) 11/22/2016   History of OSA. Has not required CPAP since May 2018    Patient Active Problem List   Diagnosis Date Noted   Coagulation defect ([place]) 10/07/2020   Acquired phimosis of penis 10/25/2019   GI bleed 09/04/2019   ILD (interstitial lung disease) ([place]) 07/30/2019   Late onset Alzheimer's disease with behavioral disturbance ([place]) 06/01/2019   Malignant neoplasm of prostate ([place]) 10/26/2018   CAP (community acquired pneumonia) 09/22/2018   SBO (small bowel obstruction) ([place]) 09/22/2018   COPD with chronic bronchitis ([place]) 09/22/2018   Nausea & vomiting 09/22/2018   Dehydration 09/22/2018   Elevated troponin 09/22/2018   Nonrheumatic aortic valve insufficiency 02/11/2018   BPH (benign prostatic hyperplasia) 07/03/2017   Diverticulosis of colon 04/09/2017   Internal hemorrhoids 04/09/2017   Atrial flutter ([place]) 11/26/2016   Dementia ([place]) 11/23/2016   Ambulates with cane 11/22/2016   History of stroke 11/22/2016   OSA (obstructive sleep apnea) 11/22/2016   HTN (hypertension) 11/22/2016   HLD (hyperlipidemia) 11/22/2016   History of MI (myocardial infarction) 11/22/2016   CHF (congestive heart failure) ([place]) 11/22/2016   History of prostate cancer 11/22/2016   History of  alcohol abuse 11/22/2016   CAD (coronary artery disease) 11/22/2016   H/O partial resection of colon 11/22/2016   Former smoker 11/22/2016   Irregular cardiac rhythm 11/22/2016    Past Surgical History:  Procedure Laterality Date   COLON RESECTION  2004   CORONARY ANGIOPLASTY WITH STENT PLACEMENT         Family History  Problem Relation Age of Onset   Hyperlipidemia Mother    Hypertension Mother    Alcohol abuse Father    Drug abuse Father    Heart disease  Father        passed away in his 59s due to MI   Asthma Sister    Healthy Brother     Social History   Tobacco Use   Smoking status: Former    Types: Cigarettes    Quit date: 1980    Years since quitting: 42.8   Smokeless tobacco: Never  Vaping Use   Vaping Use: Never used  Substance Use Topics   Alcohol use: No   Drug use: No    Home Medications Prior to Admission medications   Medication Sig Start Date End Date Taking? Authorizing Provider  albuterol (PROVENTIL) (2.5 MG/3ML) 0.083% nebulizer solution Take 2.5 mg by nebulization at bedtime.  07/23/18   [provider]  amLODipine (NORVASC) 10 MG tablet TAKE 1 TABLET BY MOUTH ONCE DAILY 06/30/18   Myles Gip, DO  amLODipine (NORVASC) 10 MG tablet Take 1 tablet by mouth daily. 07/01/20   [provider]  aspirin 81 MG EC tablet Take 81 mg by mouth daily.     [provider]  atorvastatin (LIPITOR) 40 MG tablet Take by mouth. 11/21/18   [provider]  Docusate Sodium (DSS) 100 MG CAPS Take by mouth. 11/24/18   [provider]  ELIQUIS 5 MG TABS tablet Take 5 mg by mouth 2 (two) times daily. 09/06/18   [provider]  ferrous sulfate 325 (65 FE) MG tablet Take 1 tablet by mouth daily with breakfast. 08/30/20   [provider]  furosemide (LASIX) 20 MG tablet Take 1 tablet by mouth daily. 03/30/20   [provider]  galantamine (RAZADYNE ER) 8 MG 24 hr capsule Take by mouth. 08/11/20 11/09/20  [provider]  hydrOXYzine (ATARAX/VISTARIL) 10 MG tablet Take 10 mg by mouth at bedtime as needed for sleep. 08/21/18   [provider]  metoprolol succinate (TOPROL-XL) 25 MG 24 hr tablet Take 1 tablet (25 mg total) by mouth daily. 08/30/17   Dickie La, MD  mometasone-formoterol (DULERA) 100-5 MCG/ACT AERO Inhale 2 puffs into the lungs 2 (two) times daily. 09/26/18   Eugenie Filler, MD  nitroGLYCERIN (NITROSTAT) 0.4 MG SL tablet Place 1 tablet (0.4 mg  total) under the tongue every 5 (five) minutes as needed for chest pain. 09/02/17   Smiley Houseman, MD  pantoprazole (PROTONIX) 40 MG tablet Take 40 mg by mouth daily. 03/30/20   [provider]  QUEtiapine (SEROQUEL) 25 MG tablet Take 1 1/2 tablets at 6 pm 04/12/20   [provider]  rivaroxaban (XARELTO) 20 MG TABS tablet Take by mouth. 09/14/19   [provider]  rivastigmine (EXELON) 1.5 MG capsule Take 1.5 mg by mouth 2 (two) times daily. 08/07/19   [provider]  simvastatin (ZOCOR) 20 MG tablet Take 1 tablet (20 mg total) by mouth daily at 6 PM. 11/14/17   Myles Gip, DO  tamsulosin (FLOMAX) 0.4 MG CAPS capsule  Take 0.4 mg by mouth daily.    [provider]  temazepam (RESTORIL) 7.5 MG capsule Take 7.5 mg by mouth at bedtime. 12/28/19   [provider]  umeclidinium-vilanterol (ANORO ELLIPTA) 62.5-25 MCG/INH AEPB 1INHALE 1 PUFF BY MOUTH ONCE DAILY AT 6 00 PM 12/01/19   [provider]    Allergies    Shellfish allergy and Quetiapine  Review of Systems   Review of Systems  Unable to perform ROS: Dementia   Physical Exam Updated Vital Signs BP (!) 147/91 (BP Location: Right Arm)   Pulse 85   Temp 97.9 F (36.6 C) (Oral)   Resp 18   Ht 1.816 m (5' 11.5")   Wt 83.9 kg   SpO2 93%   BMI 25.44 kg/m   Physical Exam Vitals and nursing note reviewed.  Constitutional:      Appearance: Normal appearance.  HENT:     Head: Normocephalic and atraumatic.     Right Ear: External ear normal.     Left Ear: External ear normal.     Nose: Nose normal.     Mouth/Throat:     Mouth: Mucous membranes are moist.     Pharynx: Oropharynx is clear.  Eyes:     Extraocular Movements: Extraocular movements intact.     Pupils: Pupils are equal, round, and reactive to light.  Neck:     Comments: Collar in place Collar removed and posterior neck palpated without any pain Patient was able to fully range his neck through extension  and flexion with bilateral rotation without pain Anterior neck was normal trachea midline and no JVD was noted Cardiovascular:     Rate and Rhythm: Normal rate and regular rhythm.  Pulmonary:     Effort: Pulmonary effort is normal.     Breath sounds: Normal breath sounds.  Abdominal:     General: Abdomen is flat.     Palpations: Abdomen is soft.  Musculoskeletal:        General: Normal range of motion.     Cervical back: Normal range of motion and neck supple. No rigidity or tenderness.     Comments: Back completely examined including cervical, thoracic, and lumbar spine-no external signs of trauma.  No tenderness to palpation Hips without any signs of trauma or tenderness palpation Lower extremities fully ranged through hip, knee, ankle flexion extension and rotation without any tenderness Upper extremities reveal no obvious external signs of trauma with pulses intact and patient is able to range his arms including shoulders, elbows, fingers, and wrist through full range of motion without any tenderness  Skin:    General: Skin is warm and dry.     Capillary Refill: Capillary refill takes less than 2 seconds.  Neurological:     General: No focal deficit present.     Mental Status: He is alert.     Cranial Nerves: No cranial nerve deficit.     Coordination: Coordination normal.     Deep Tendon Reflexes: Reflexes normal.     Comments: She is oriented to person but not to time or place and according to his wife this is baseline There is no evidence of focal abnormality Exam is consistent with reported advanced Alzheimer's dementia  Psychiatric:        Mood and Affect: Mood normal.     Comments: Patient is appropriate with her interactions but shows abnormalities consistent with Alzheimer's and unawareness of his environment.    ED Results / Procedures / Treatments   Labs (  all labs ordered are listed, but only abnormal results are displayed) Labs Reviewed - No data to  display  EKG EKG Interpretation  Date/Time:  Friday February 17 2021 09:08:32 EDT Ventricular Rate:  91 PR Interval:    QRS Duration: 126 QT Interval:  355 QTC Calculation: 400 R Axis:   89 Text Interpretation: Atrial flutter Multiple ventricular premature complexes Nonspecific intraventricular conduction delay Anteroseptal infarct, age indeterminate Artifact in lead(s) I III aVL V1 V2 V6 credit Confirmed by Pattricia Boss [id number]) on 02/17/2021 9:15:49 AM  Radiology CT Head Wo Contrast  Result Date: 02/17/2021 CLINICAL DATA:  Found on the ground with concern for fall EXAM: CT HEAD WITHOUT CONTRAST TECHNIQUE: Contiguous axial images were obtained from the base of the skull through the vertex without intravenous contrast. COMPARISON:  CT head 12/16/2018 FINDINGS: Brain: There is no acute intracranial hemorrhage, extra-axial fluid collection, or acute infarct. There is mild global parenchymal volume loss. Hypodensity in the subcortical and periventricular white matter likely reflects sequela of chronic white matter microangiopathy. The ventricles are not enlarged. There is no mass lesion. There is no midline shift. Vascular: There is calcification of the bilateral cavernous ICAs and vertebral arteries. Skull: Normal. Negative for fracture or focal lesion. Sinuses/Orbits: The imaged paranasal sinuses are clear. Bilateral lens implants are in place. The globes and orbits are otherwise unremarkable. Other: None. IMPRESSION: 1. No acute intracranial pathology. 2. Global parenchymal volume loss and chronic white matter microangiopathy. Electronically Signed   By: Valetta Mole M.D.   On: 02/17/2021 10:57   CT Cervical Spine Wo Contrast  Result Date: 02/17/2021 CLINICAL DATA:  Found down, concern for fall EXAM: CT CERVICAL SPINE WITHOUT CONTRAST TECHNIQUE: Multidetector CT imaging of the cervical spine was performed without intravenous contrast. Multiplanar CT image reconstructions were also generated.  COMPARISON:  None. FINDINGS: Alignment: There is trace retrolisthesis of C4 on C5 and C5 on C6, likely degenerative in nature. There is no jumped or perched facet or other evidence of traumatic malalignment. Skull base and vertebrae: Skull base alignment is maintained. Vertebral body heights are preserved. There is no evidence of acute fracture. Soft tissues and spinal canal: No prevertebral fluid or swelling. No visible canal hematoma. Disc levels: There is multilevel intervertebral disc space narrowing and associated uncovertebral arthropathy, most advanced at C3-C4 through C5-C6. There is relatively mild multilevel facet arthropathy. The osseous spinal canal is patent. Uncovertebral ridging results in moderate to severe right neural foraminal stenosis at C3-C4 through C6-C7. Upper chest: There is emphysema in the lung apices. Other: There is calcified atherosclerotic plaque of the bilateral carotid bulbs. There is debris layering in the oropharynx/hypopharynx. IMPRESSION: 1. No acute fracture or traumatic malalignment of the cervical spine. 2. Multilevel degenerative changes as above resulting in moderate to severe right neural foraminal stenosis at multiple levels. Electronically Signed   By: Valetta Mole M.D.   On: 02/17/2021 11:02    Procedures Procedures   Medications Ordered in ED Medications - No data to display  ED Course  I have reviewed the triage vital signs and the nursing notes.  Pertinent labs & imaging results that were available during my care of the patient were reviewed by me and considered in my medical decision making (see chart for details).    MDM Rules/Calculators/A&P                           Discussed all above with the patient's wife.  Patient with  normal exam although concern for fall.  He is not reported to be on any blood thinners.  There are no signs of trauma to head head neck or rest of his body.  Plan to attempt to ambulate and patient will be discharged back to  facility 11:15 AM This RN, spoke with facility who stated that patient told them he had a headache and neck pain after fall.  This is new information that we had not received previously.  Will add head CT and cervical spine CT. CT of head and neck obtained without any evidence of acute abnormality will proceed with discharge Final Clinical Impression(s) / ED Diagnoses Final diagnoses:  Fall, initial encounter  Severe Alzheimer's dementia, unspecified timing of dementia onset, unspecified whether behavioral, psychotic, or mood disturbance or anxiety [facility])    Rx / DC Orders ED Discharge Orders     None        Pattricia Boss, MD 02/17/21 2257    Pattricia Boss, MD 02/17/21 1115

## 2021-02-17 NOTE — ED Triage Notes (Signed)
Pt to er room number 11 via ems, per ems pt is here from [facility], states that he was an unwitnessed fall with unknown down time, states that pt is at baseline. Pt oriented to person, doesn't know place or day of the week, MD at bedside, re oriented pt.  Pt has c collar in place

## 2021-02-17 NOTE — ED Notes (Signed)
Talked with Vangie at facility, gave report and reviewed care, she states that pt was sent in for c/o dizziness and hitting his head, md notified, ct scan ordered.

## 2021-02-17 NOTE — Discharge Instructions (Addendum)
No evidence of injury was noted from the fall today.  There is no evidence of bruising or contusion on the patient's body and he has no complaints of pain. Wife was informed of his status and he appears stable to be discharged back to his memory care unit.

## 2021-02-23 ENCOUNTER — Encounter (HOSPITAL_COMMUNITY): Payer: Self-pay | Admitting: Emergency Medicine

## 2021-02-23 ENCOUNTER — Other Ambulatory Visit: Payer: Self-pay

## 2021-02-23 ENCOUNTER — Emergency Department (HOSPITAL_COMMUNITY): Payer: Medicare Other

## 2021-02-23 ENCOUNTER — Inpatient Hospital Stay (HOSPITAL_COMMUNITY)
Admission: EM | Admit: 2021-02-23 | Discharge: 2021-02-28 | DRG: 951 | Disposition: E | Payer: Medicare Other | Attending: Internal Medicine | Admitting: Internal Medicine

## 2021-02-23 DIAGNOSIS — Z79899 Other long term (current) drug therapy: Secondary | ICD-10-CM

## 2021-02-23 DIAGNOSIS — Z7901 Long term (current) use of anticoagulants: Secondary | ICD-10-CM

## 2021-02-23 DIAGNOSIS — Z515 Encounter for palliative care: Principal | ICD-10-CM

## 2021-02-23 DIAGNOSIS — Z8673 Personal history of transient ischemic attack (TIA), and cerebral infarction without residual deficits: Secondary | ICD-10-CM

## 2021-02-23 DIAGNOSIS — I5032 Chronic diastolic (congestive) heart failure: Secondary | ICD-10-CM | POA: Diagnosis present

## 2021-02-23 DIAGNOSIS — Z66 Do not resuscitate: Secondary | ICD-10-CM

## 2021-02-23 DIAGNOSIS — Z8546 Personal history of malignant neoplasm of prostate: Secondary | ICD-10-CM

## 2021-02-23 DIAGNOSIS — F02818 Dementia in other diseases classified elsewhere, unspecified severity, with other behavioral disturbance: Secondary | ICD-10-CM | POA: Diagnosis present

## 2021-02-23 DIAGNOSIS — Z20822 Contact with and (suspected) exposure to covid-19: Secondary | ICD-10-CM | POA: Diagnosis present

## 2021-02-23 DIAGNOSIS — I2510 Atherosclerotic heart disease of native coronary artery without angina pectoris: Secondary | ICD-10-CM | POA: Diagnosis present

## 2021-02-23 DIAGNOSIS — J9601 Acute respiratory failure with hypoxia: Secondary | ICD-10-CM | POA: Diagnosis not present

## 2021-02-23 DIAGNOSIS — Z923 Personal history of irradiation: Secondary | ICD-10-CM

## 2021-02-23 DIAGNOSIS — Z7189 Other specified counseling: Secondary | ICD-10-CM | POA: Diagnosis not present

## 2021-02-23 DIAGNOSIS — Z7982 Long term (current) use of aspirin: Secondary | ICD-10-CM

## 2021-02-23 DIAGNOSIS — R0602 Shortness of breath: Secondary | ICD-10-CM

## 2021-02-23 DIAGNOSIS — Z888 Allergy status to other drugs, medicaments and biological substances status: Secondary | ICD-10-CM

## 2021-02-23 DIAGNOSIS — Z87891 Personal history of nicotine dependence: Secondary | ICD-10-CM

## 2021-02-23 DIAGNOSIS — Z8249 Family history of ischemic heart disease and other diseases of the circulatory system: Secondary | ICD-10-CM

## 2021-02-23 DIAGNOSIS — I252 Old myocardial infarction: Secondary | ICD-10-CM

## 2021-02-23 DIAGNOSIS — R54 Age-related physical debility: Secondary | ICD-10-CM | POA: Diagnosis present

## 2021-02-23 DIAGNOSIS — Z91013 Allergy to seafood: Secondary | ICD-10-CM

## 2021-02-23 DIAGNOSIS — Z955 Presence of coronary angioplasty implant and graft: Secondary | ICD-10-CM

## 2021-02-23 DIAGNOSIS — Z825 Family history of asthma and other chronic lower respiratory diseases: Secondary | ICD-10-CM

## 2021-02-23 DIAGNOSIS — J690 Pneumonitis due to inhalation of food and vomit: Secondary | ICD-10-CM | POA: Diagnosis not present

## 2021-02-23 DIAGNOSIS — R296 Repeated falls: Secondary | ICD-10-CM | POA: Diagnosis present

## 2021-02-23 DIAGNOSIS — Z7951 Long term (current) use of inhaled steroids: Secondary | ICD-10-CM

## 2021-02-23 DIAGNOSIS — E785 Hyperlipidemia, unspecified: Secondary | ICD-10-CM | POA: Diagnosis present

## 2021-02-23 DIAGNOSIS — Z789 Other specified health status: Secondary | ICD-10-CM

## 2021-02-23 DIAGNOSIS — Z9049 Acquired absence of other specified parts of digestive tract: Secondary | ICD-10-CM

## 2021-02-23 DIAGNOSIS — N400 Enlarged prostate without lower urinary tract symptoms: Secondary | ICD-10-CM | POA: Diagnosis present

## 2021-02-23 DIAGNOSIS — N179 Acute kidney failure, unspecified: Secondary | ICD-10-CM | POA: Diagnosis present

## 2021-02-23 DIAGNOSIS — J449 Chronic obstructive pulmonary disease, unspecified: Secondary | ICD-10-CM | POA: Diagnosis present

## 2021-02-23 DIAGNOSIS — I110 Hypertensive heart disease with heart failure: Secondary | ICD-10-CM | POA: Diagnosis present

## 2021-02-23 DIAGNOSIS — G301 Alzheimer's disease with late onset: Secondary | ICD-10-CM | POA: Diagnosis present

## 2021-02-23 DIAGNOSIS — G4733 Obstructive sleep apnea (adult) (pediatric): Secondary | ICD-10-CM | POA: Diagnosis present

## 2021-02-23 LAB — I-STAT ARTERIAL BLOOD GAS, ED
Acid-base deficit: 14 mmol/L — ABNORMAL HIGH (ref 0.0–2.0)
Bicarbonate: 10.8 mmol/L — ABNORMAL LOW (ref 20.0–28.0)
Calcium, Ion: 1.11 mmol/L — ABNORMAL LOW (ref 1.15–1.40)
HCT: 36 % — ABNORMAL LOW (ref 39.0–52.0)
Hemoglobin: 12.2 g/dL — ABNORMAL LOW (ref 13.0–17.0)
O2 Saturation: 89 %
Potassium: 3.7 mmol/L (ref 3.5–5.1)
Sodium: 138 mmol/L (ref 135–145)
TCO2: 11 mmol/L — ABNORMAL LOW (ref 22–32)
pCO2 arterial: 21.9 mmHg — ABNORMAL LOW (ref 32.0–48.0)
pH, Arterial: 7.302 — ABNORMAL LOW (ref 7.350–7.450)
pO2, Arterial: 60 mmHg — ABNORMAL LOW (ref 83.0–108.0)

## 2021-02-23 LAB — COMPREHENSIVE METABOLIC PANEL
ALT: 24 U/L (ref 0–44)
AST: 49 U/L — ABNORMAL HIGH (ref 15–41)
Albumin: 3 g/dL — ABNORMAL LOW (ref 3.5–5.0)
Alkaline Phosphatase: 95 U/L (ref 38–126)
Anion gap: 18 — ABNORMAL HIGH (ref 5–15)
BUN: 124 mg/dL — ABNORMAL HIGH (ref 8–23)
CO2: 14 mmol/L — ABNORMAL LOW (ref 22–32)
Calcium: 9 mg/dL (ref 8.9–10.3)
Chloride: 103 mmol/L (ref 98–111)
Creatinine, Ser: 10.12 mg/dL — ABNORMAL HIGH (ref 0.61–1.24)
GFR, Estimated: 5 mL/min — ABNORMAL LOW (ref >60)
Glucose, Bld: 126 mg/dL — ABNORMAL HIGH (ref 70–99)
Potassium: 5.7 mmol/L — ABNORMAL HIGH (ref 3.5–5.1)
Sodium: 135 mmol/L (ref 135–145)
Total Bilirubin: 1.9 mg/dL — ABNORMAL HIGH (ref 0.3–1.2)
Total Protein: 6.8 g/dL (ref 6.5–8.1)

## 2021-02-23 LAB — CBC
HCT: 41.9 % (ref 39.0–52.0)
Hemoglobin: 13.7 g/dL (ref 13.0–17.0)
MCH: 29.5 pg (ref 26.0–34.0)
MCHC: 32.7 g/dL (ref 30.0–36.0)
MCV: 90.1 fL (ref 80.0–100.0)
Platelets: 243 10*3/uL (ref 150–400)
RBC: 4.65 MIL/uL (ref 4.22–5.81)
RDW: 17.2 % — ABNORMAL HIGH (ref 11.5–15.5)
WBC: 35.6 10*3/uL — ABNORMAL HIGH (ref 4.0–10.5)
nRBC: 0 % (ref 0.0–0.2)

## 2021-02-23 LAB — RESP PANEL BY RT-PCR (FLU A&B, COVID) ARPGX2
Influenza A by PCR: NEGATIVE
Influenza B by PCR: NEGATIVE
SARS Coronavirus 2 by RT PCR: NEGATIVE

## 2021-02-23 LAB — LIPASE, BLOOD: Lipase: 31 U/L (ref 11–51)

## 2021-02-23 MED ORDER — FENTANYL CITRATE PF 50 MCG/ML IJ SOSY
75.0000 ug | PREFILLED_SYRINGE | Freq: Once | INTRAMUSCULAR | Status: AC
  Administered 2021-02-23: 75 ug via INTRAVENOUS
  Filled 2021-02-23: qty 2

## 2021-02-23 MED ORDER — LORAZEPAM 1 MG PO TABS: 1.0000 mg | ORAL_TABLET | ORAL | Status: DC | PRN

## 2021-02-23 MED ORDER — LOPERAMIDE HCL 2 MG PO CAPS: 2.0000 mg | ORAL_CAPSULE | ORAL | Status: DC | PRN

## 2021-02-23 MED ORDER — POLYVINYL ALCOHOL 1.4 % OP SOLN: 1.0000 [drp] | Freq: Four times a day (QID) | OPHTHALMIC | Status: DC | PRN

## 2021-02-23 MED ORDER — ONDANSETRON HCL 4 MG/2ML IJ SOLN: 4.0000 mg | Freq: Four times a day (QID) | INTRAMUSCULAR | Status: DC | PRN

## 2021-02-23 MED ORDER — BIOTENE DRY MOUTH MT LIQD
15.0000 mL | Freq: Two times a day (BID) | OROMUCOSAL | Status: DC
  Administered 2021-02-24 – 2021-02-25 (×3): 15 mL via OROMUCOSAL

## 2021-02-23 MED ORDER — SODIUM CHLORIDE 0.9 % IV BOLUS
500.0000 mL | Freq: Once | INTRAVENOUS | Status: AC
  Administered 2021-02-23: 500 mL via INTRAVENOUS

## 2021-02-23 MED ORDER — ACETAMINOPHEN 650 MG RE SUPP: 650.0000 mg | Freq: Four times a day (QID) | RECTAL | Status: DC | PRN

## 2021-02-23 MED ORDER — GLYCOPYRROLATE 0.2 MG/ML IJ SOLN: 0.2000 mg | INTRAMUSCULAR | Status: DC | PRN

## 2021-02-23 MED ORDER — GLYCOPYRROLATE 0.2 MG/ML IJ SOLN
0.2000 mg | INTRAMUSCULAR | Status: DC | PRN
  Administered 2021-02-25 (×2): 0.2 mg via INTRAVENOUS
  Filled 2021-02-23 (×2): qty 1

## 2021-02-23 MED ORDER — HALOPERIDOL LACTATE 2 MG/ML PO CONC: 2.0000 mg | Freq: Four times a day (QID) | ORAL | Status: DC | PRN

## 2021-02-23 MED ORDER — ONDANSETRON 4 MG PO TBDP: 4.0000 mg | ORAL_TABLET | Freq: Four times a day (QID) | ORAL | Status: DC | PRN

## 2021-02-23 MED ORDER — LORAZEPAM 2 MG/ML IJ SOLN
1.0000 mg | INTRAMUSCULAR | Status: DC | PRN
  Administered 2021-02-23 – 2021-02-24 (×2): 1 mg via INTRAVENOUS
  Filled 2021-02-23 (×2): qty 1

## 2021-02-23 MED ORDER — LORAZEPAM 2 MG/ML PO CONC: 1.0000 mg | ORAL | Status: DC | PRN

## 2021-02-23 MED ORDER — LORAZEPAM 2 MG/ML IJ SOLN
1.0000 mg | Freq: Once | INTRAMUSCULAR | Status: AC
  Administered 2021-02-23: 1 mg via INTRAVENOUS
  Filled 2021-02-23: qty 1

## 2021-02-23 MED ORDER — FENTANYL CITRATE PF 50 MCG/ML IJ SOSY
50.0000 ug | PREFILLED_SYRINGE | INTRAMUSCULAR | Status: DC | PRN
  Administered 2021-02-24 (×2): 50 ug via INTRAVENOUS
  Filled 2021-02-23: qty 1
  Filled 2021-02-23: qty 2

## 2021-02-23 MED ORDER — HALOPERIDOL LACTATE 2 MG/ML PO CONC: 0.5000 mg | ORAL | Status: DC | PRN

## 2021-02-23 MED ORDER — HALOPERIDOL LACTATE 5 MG/ML IJ SOLN: 0.5000 mg | INTRAMUSCULAR | Status: DC | PRN

## 2021-02-23 MED ORDER — GLYCOPYRROLATE 1 MG PO TABS: 1.0000 mg | ORAL_TABLET | ORAL | Status: DC | PRN

## 2021-02-23 MED ORDER — LORAZEPAM 2 MG/ML IJ SOLN: 1.0000 mg | INTRAMUSCULAR | Status: DC | PRN

## 2021-02-23 MED ORDER — HALOPERIDOL LACTATE 5 MG/ML IJ SOLN: 2.0000 mg | Freq: Four times a day (QID) | INTRAMUSCULAR | Status: DC | PRN

## 2021-02-23 MED ORDER — ACETAMINOPHEN 325 MG PO TABS: 650.0000 mg | ORAL_TABLET | Freq: Four times a day (QID) | ORAL | Status: DC | PRN

## 2021-02-23 MED ORDER — HALOPERIDOL 0.5 MG PO TABS: 0.5000 mg | ORAL_TABLET | ORAL | Status: DC | PRN

## 2021-02-23 MED ORDER — HALOPERIDOL 1 MG PO TABS: 2.0000 mg | ORAL_TABLET | Freq: Four times a day (QID) | ORAL | Status: DC | PRN

## 2021-02-23 MED ORDER — MORPHINE SULFATE (PF) 2 MG/ML IV SOLN
2.0000 mg | Freq: Once | INTRAVENOUS | Status: AC
  Administered 2021-02-23: 2 mg via INTRAVENOUS
  Filled 2021-02-23: qty 1

## 2021-02-23 MED ORDER — FENTANYL CITRATE PF 50 MCG/ML IJ SOSY: 25.0000 ug | PREFILLED_SYRINGE | INTRAMUSCULAR | Status: DC | PRN

## 2021-02-23 NOTE — Progress Notes (Addendum)
Brief Palliative Medicine Progress Note:  PMT consult received and chart reviewed. GOC with family completed. Full note to follow:  Recommendations/Plan Initiated full comfort measures Continue DNR/DNI as previously documented Family are not interested in transfer to residential hospice facility at this time - anticipate hospital death Added orders for EOL symptom management and to reflect full comfort measures, as well as discontinued orders that were not focused on comfort Family wish to continue oxygen (without escalation) until tomorrow - they are hopeful other family can visit tonight Chaplain notified and consult placed for: family's request for prayer and emotional support Unrestricted visitation orders were placed per current [facility] EOL visitation policy  Nursing to provide frequent assessments and administer PRN medications as clinically necessary to ensure EOL comfort PMT will continue to follow and support holistically   Thank you for allowing PMT to assist in the care of this patient.  Ashanty Coltrane M. Tamala Julian [facility] Palliative Medicine Team Team Phone: [phone] NO CHARGE

## 2021-02-23 NOTE — ED Triage Notes (Signed)
BIB EMS after suspected aspiration of chocolate pudding during ST at SNF. Pt was given 5mg  albuterol and 60mg  of solumedrol at facility w/no relief, pt lives on the memory care unit and has auditory wheezing and labored breathing

## 2021-02-23 NOTE — ED Notes (Signed)
Got patient on the monitor got patient cleaned up placed another brief did ekg shown to Dr Jeanell Sparrow patient is resting with call bell in reach and nurse at bedside

## 2021-02-23 NOTE — ED Notes (Signed)
Family at the bedside.

## 2021-02-23 NOTE — ED Provider Notes (Signed)
Care taken over from Dr. Jeanell Sparrow.  It was decided between Dr. Jeanell Sparrow and family at bedside that pt is to be full comfort care.  Awaiting palliative care consult.  I spoke with the resident on the internal medicine teaching service who will admit the pt.   Malvin Johns, MD 02/02/2021 1725

## 2021-02-23 NOTE — Progress Notes (Signed)
Chaplain paged by the palliative team to offer support for this patient as he has been moved to comfort care.  Patient's spouse and sister-in-law present at bedside.  Chaplain sat beside the spouse, Enid Derry, who kept saying over and over, this happened so fast.  Family trying to wrap their heads around the patient nearing EOL.  Patient and spouse moved from the [place] area to her in 2005.  Patient has some estranged children and one sister who is in the [place] area.  Faith is very important to them and they identify as Christians, [place] offered empathetic/reflective listening as family processes.  Chaplain offered ways for them to be supportive to the patient in holding his hand and talking to him.  Chaplain offered prayer.  Other family arrived.  Will pass on to day chaplain to follow up provided patient is here.   Chaplain Katherene Ponto, Mdiv.    01/28/2021 1916  Clinical Encounter Type  Visited With Patient and family together;Health care provider  Visit Type Patient actively dying;Spiritual support  Referral From Palliative care team  Consult/Referral To Chaplain  Spiritual Encounters  Spiritual Needs Prayer;Emotional  Stress Factors  Family Stress Factors Family relationships

## 2021-02-23 NOTE — ED Notes (Signed)
Family updated as to patient's status. Conversation with Mr Cory's wife and sister in law to make sure they are in understanding of comfort and end of life care.  They both expressed understanding are comfortable with monitor being turned off.

## 2021-02-23 NOTE — H&P (Addendum)
Date: 02/16/2021               Patient Name:  Charles Chan MRN: [id number]  DOB: 11-20-1940 Age / Sex: 80 y.o., male   PCP: Patient, No Pcp Per (Inactive)         Medical Service: Internal Medicine Teaching Service         Attending Physician: Dr. Dareen Piano    First Contact: Lajean Manes, MD Pager: [phone]  Second Contact: Iona Beard, MD Pager: [place] Rooks [phone]       After Hours (After 5p/  First Contact Pager: [phone]  weekends / holidays): Second Contact Pager: [phone]    Chief Complaint: comfort care  History of Present Illness:  Charles Chan is a 80 y.o. male with a pertinent PMH of CAD, HFpEF, HTN, HLD, OSA not on CPAP, and advanced dementia who is a resident at a ALF since 1 week ago. He previously lived at home with his wife, but worsening dementia resulting in frequent falls required assistance in care. He is brought to the [facility] ED because of acute hypoxic respiratory distress. He is reported to have labored breathing at the facility, 2/2 aspiration during a swallowing test at the facility.   On presentation, his WBC is elevated at 35.6, BUN 124, Cr 10.1 (baseline 1). CXR consistent with aspiration pneumonia. ED physician, Dr. Jeanell Sparrow, explained the acuity of his condition and poor prognosis, and his wife, his POA, expressed that pt would want comfort care measures only. Details of comfort care were explained to the family. It was decided between Dr. Jeanell Sparrow and family at bedside that pt is to be full comfort care. No Abx given, and pt continues to have labored breathing with supplemental O2, with stridor and wheezing. Palliative consult pending at this time.   Meds:  Current Meds  Medication Sig   albuterol (PROVENTIL) (2.5 MG/3ML) 0.083% nebulizer solution Take 2.5 mg by nebulization at bedtime.    amLODipine (NORVASC) 10 MG tablet TAKE 1 TABLET BY MOUTH ONCE DAILY (Patient taking differently: Take 10 mg by mouth daily.)   aspirin 81 MG chewable tablet Take 81 mg by mouth  daily.    atorvastatin (LIPITOR) 40 MG tablet Take 40 mg by mouth daily.   ferrous sulfate 325 (65 FE) MG tablet Take 1 tablet by mouth daily with breakfast.   furosemide (LASIX) 20 MG tablet Take 1 tablet by mouth daily.   metoprolol succinate (TOPROL-XL) 25 MG 24 hr tablet Take 1 tablet (25 mg total) by mouth daily.   nitroGLYCERIN (NITROSTAT) 0.4 MG SL tablet Place 1 tablet (0.4 mg total) under the tongue every 5 (five) minutes as needed for chest pain.   pantoprazole (PROTONIX) 40 MG tablet Take 40 mg by mouth daily.   tamsulosin (FLOMAX) 0.4 MG CAPS capsule Take 0.4 mg by mouth daily.   umeclidinium-vilanterol (ANORO ELLIPTA) 62.5-25 MCG/INH AEPB Inhale 1 puff into the lungs daily at 6 PM.     Allergies: Allergies as of 02/18/2021 - Review Complete 02/20/2021  Allergen Reaction Noted   Shellfish allergy Hives 11/22/2016   Quetiapine Anxiety 08/11/2020   Past Medical History:  Diagnosis Date   Ambulates with cane 11/22/2016   In the 90s, patient injured his knee (ligament tear?) and declined surgery. Since then he has been using a cane to ambulate. Reports he is not in pain.    CAD (coronary artery disease) 11/22/2016   With Stent in 1998   CHF (congestive heart failure) ([place]) 11/22/2016  Diagnosed in 1998   Dementia ([place]) 11/23/2016   Former smoker 11/22/2016   Quit 1980s   H/O partial resection of colon 11/22/2016   Due to polyp in 2004    History of alcohol abuse 11/22/2016   1970s   History of MI (myocardial infarction) 11/22/2016   2001   History of prostate cancer 11/22/2016   S/p radiation in 2015? Was followed by previous oncologist yearly (Dr. Nona Dell in [place])    History of stroke 11/22/2016   HLD (hyperlipidemia) 11/22/2016   HTN (hypertension) 11/22/2016   Irregular cardiac rhythm 11/22/2016   OSA (obstructive sleep apnea) 11/22/2016   History of OSA. Has not required CPAP since May 2018    Family History:  Mother: HLD and HTN Father: Heart disease, asthma    Social History:   Has been living with his wife at home up until 1 week ago, when he was transferred to an ALF because of worsening dementia, resulting in falls.  Family denies current tobacco and alcohol use.  IADLs/ADLs- requires assistance baseline   Review of Systems: A complete ROS was negative except as per HPI.    Physical Exam: Blood pressure (!) 158/84, pulse (!) 52, temperature 97.8 F (36.6 C), temperature source Rectal, resp. rate (!) 32, height 5\' 11"  (1.803 m), weight 84 kg, SpO2 (!) 87 %.  Constitutional: not alert but intermittently responsive at times, ill-appearing, in acute distress, possibly A&O x 1 HENT: normocephalic, atraumatic, mucous membranes moist Eyes: eyes closed Cardiovascular: regular rhythm, tachycardic, no m/r/g, trace edematous bilateral LE Pulmonary/Chest: labored work of breathing on supplemental O2, coarse breath sounds bilaterally.  Abdominal: non-tender to palpation, distended abdomen  Skin: cool extremities and dry Psych: normal behavior, normal affect  EKG: sinus tachy   CXR: consistent with aspiration pneumonia.  Assessment & Plan by Problem: Active Problems:   * No active hospital problems. *  Charles Chan is a 80 y.o. male with multiple comorbidities including advanced dementia who is brought to the ED in acute hypoxic respiratory failure 2/2 aspiration pneumonia, and admitted for comfort care.    Advanced dementia, on comfort care  Acute hypoxic respiratory failure Acute hypoxic respiratory failure with O2 saturation down to 87% on 2 L [place] in setting of aspiration PNA, in a pt with advanced dementia with poor PO intake and decreased mental alertness. WBC is elevated at 35.6, BUN 124, Cr 10.1 (baseline 1). CXR consistent with aspiration pneumonia. He is tachycardic, tachypnic,and hypertensive. His wife, POA, and his sister-in-law are informed about poor trajectory, and opting for comfort care measures at this time. Palliative care  consult placed.   - Continue comfort care and comfort feeds at this time  - Supplemental O2 as needed  - Follow up on palliative medicine recommendations  - Continue updating pt's wife Enid Derry, his POA.  - Will consider inpatient hospice placement if meeting recommendations   Best Practice: Diet: Finger foods  IVF: none  VTE: none  Code: DNR/DNI   Lajean Manes, MD  Internal Medicine Resident, PGY-1 [facility]  Pager: [phone] 5:17 PM, 01/31/2021

## 2021-02-23 NOTE — ED Notes (Signed)
Pt resting comfortably at this time. Family at bedside. Pt placed on PrimoFit.

## 2021-02-23 NOTE — ED Provider Notes (Signed)
[name]ERGENCY DEPARTMENT Provider Note   CSN: [id number] Arrival date & time: 02/12/2021  1447     History Chief Complaint  Patient presents with   Aspiration    Charles Chan is a 80 y.o. male.  HPI 5 caveat secondary to dementia and acute respiratory distress History obtained from ED RN who obtained history from EMS.  Patient is at a skilled nursing facility memory unit.  They reported that he was suspected to aspirate putting today during his speech therapy.  He is in acute respiratory distress.  They reported wheezing and labored breathing. Also reportedly had 2 episodes of watery stool.  He had stool on him when he arrived to the ED. Patient has DNR bedside    Past Medical History:  Diagnosis Date   Ambulates with cane 11/22/2016   In the 90s, patient injured his knee (ligament tear?) and declined surgery. Since then he has been using a cane to ambulate. Reports he is not in pain.    CAD (coronary artery disease) 11/22/2016   With Stent in 1998   CHF (congestive heart failure) ([place]) 11/22/2016   Diagnosed in 1998   Dementia ([place]) 11/23/2016   Former smoker 11/22/2016   Quit 1980s   H/O partial resection of colon 11/22/2016   Due to polyp in 2004    History of alcohol abuse 11/22/2016   1970s   History of MI (myocardial infarction) 11/22/2016   2001   History of prostate cancer 11/22/2016   S/p radiation in 2015? Was followed by previous oncologist yearly (Dr. Nona Chan in [place])    History of stroke 11/22/2016   HLD (hyperlipidemia) 11/22/2016   HTN (hypertension) 11/22/2016   Irregular cardiac rhythm 11/22/2016   OSA (obstructive sleep apnea) 11/22/2016   History of OSA. 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   Nausea & vomiting 09/22/2018   Dehydration 09/22/2018   Elevated troponin 09/22/2018   Nonrheumatic aortic valve insufficiency 02/11/2018   BPH (benign prostatic hyperplasia) 07/03/2017   Diverticulosis of colon 04/09/2017   Internal hemorrhoids 04/09/2017   Atrial flutter ([place]) 11/26/2016   Dementia ([place]) 11/23/2016   Ambulates with cane 11/22/2016   History of stroke 11/22/2016   OSA (obstructive sleep apnea) 11/22/2016   HTN (hypertension) 11/22/2016   HLD (hyperlipidemia) 11/22/2016   History of MI (myocardial infarction) 11/22/2016   CHF (congestive heart failure) ([place]) 11/22/2016   History of prostate cancer 11/22/2016   History of alcohol abuse 11/22/2016   CAD (coronary artery disease) 11/22/2016   H/O partial resection of colon 11/22/2016   Former smoker 11/22/2016   Irregular cardiac rhythm 11/22/2016    Past Surgical History:  Procedure Laterality Date   COLON RESECTION  2004   CORONARY ANGIOPLASTY WITH STENT PLACEMENT         Family History  Problem Relation Age of Onset   Hyperlipidemia Mother    Hypertension Mother    Alcohol abuse Father    Drug abuse Father    Heart disease Father  passed away in his 77s due to MI   Asthma Sister    Healthy Brother     Social History   Tobacco Use   Smoking status: Former    Types: Cigarettes    Quit date: 1980    Years since quitting: 42.8   Smokeless tobacco: Never  Vaping Use   Vaping Use: Never used  Substance Use Topics   Alcohol use: No   Drug use: No    Home Medications Prior to Admission medications   Medication Sig Start Date End Date Taking? Authorizing Provider  albuterol (PROVENTIL) (2.5 MG/3ML) 0.083% nebulizer solution Take 2.5 mg by  nebulization at bedtime.  07/23/18  Yes [provider]  amLODipine (NORVASC) 10 MG tablet TAKE 1 TABLET BY MOUTH ONCE DAILY Patient taking differently: Take 10 mg by mouth daily. 06/30/18  Yes Myles Gip, DO  aspirin 81 MG chewable tablet Take 81 mg by mouth daily.    Yes [provider]  atorvastatin (LIPITOR) 40 MG tablet Take 40 mg by mouth daily. 11/21/18  Yes [provider]  ferrous sulfate 325 (65 FE) MG tablet Take 1 tablet by mouth daily with breakfast. 08/30/20  Yes [provider]  furosemide (LASIX) 20 MG tablet Take 1 tablet by mouth daily. 03/30/20  Yes [provider]  metoprolol succinate (TOPROL-XL) 25 MG 24 hr tablet Take 1 tablet (25 mg total) by mouth daily. 08/30/17  Yes Dickie La, MD  nitroGLYCERIN (NITROSTAT) 0.4 MG SL tablet Place 1 tablet (0.4 mg total) under the tongue every 5 (five) minutes as needed for chest pain. 09/02/17  Yes Smiley Houseman, MD  pantoprazole (PROTONIX) 40 MG tablet Take 40 mg by mouth daily. 03/30/20  Yes [provider]  tamsulosin (FLOMAX) 0.4 MG CAPS capsule Take 0.4 mg by mouth daily.   Yes [provider]  umeclidinium-vilanterol (ANORO ELLIPTA) 62.5-25 MCG/INH AEPB Inhale 1 puff into the lungs daily at 6 PM. 12/01/19  Yes [provider]  ELIQUIS 5 MG TABS tablet Take 5 mg by mouth 2 (two) times daily. Patient not taking: No sig reported 09/06/18   [provider]  galantamine (RAZADYNE ER) 8 MG 24 hr capsule Take by mouth. 08/11/20 11/09/20  [provider]  mometasone-formoterol (DULERA) 100-5 MCG/ACT AERO Inhale 2 puffs into the lungs 2 (two) times daily. Patient not taking: No sig reported 09/26/18   Charles Filler, MD  rivaroxaban (XARELTO) 20 MG TABS tablet Take 20 mg by mouth daily. Patient not taking: No sig reported 09/14/19   [provider]  simvastatin (ZOCOR) 20 MG tablet Take 1 tablet (20 mg total) by mouth daily at 6 PM. Patient  not taking: No sig reported 11/14/17   Myles Gip, DO    Allergies    Shellfish allergy and Quetiapine  Review of Systems   Review of Systems  Unable to perform ROS: Dementia   Physical Exam Updated Vital Signs BP (!) 145/59   Pulse (!) 104   Temp 97.8 F (36.6 C) (Rectal)   Resp (!) 26   Ht 1.803 m (5\' 11" )   Wt 84 kg   SpO2 93%   BMI 25.83 kg/m   Physical Exam Vitals and nursing note reviewed.  Constitutional:      General: He is in acute distress.     Appearance: Normal appearance.  HENT:     Head: Normocephalic.     Right Ear: External ear normal.  Left Ear: External ear normal.     Nose: Nose normal.     Mouth/Throat:     Pharynx: Oropharynx is clear.  Eyes:     Pupils: Pupils are equal, round, and reactive to light.  Cardiovascular:     Rate and Rhythm: Tachycardia present.     Pulses: Normal pulses.  Pulmonary:     Effort: Respiratory distress present.     Breath sounds: Rhonchi present.  Abdominal:     General: There is distension.     Tenderness: There is abdominal tenderness.  Musculoskeletal:     Cervical back: Normal range of motion.     Right lower leg: Edema present.     Left lower leg: Edema present.  Skin:    General: Skin is warm and dry.     Capillary Refill: Capillary refill takes less than 2 seconds.  Neurological:     Mental Status: He is alert.     Comments: Patient lying with eyes closed. He does answer some yes/no questions but appears to be in significant distress. No lateralized deficits were appreciated on my exam    ED Results / Procedures / Treatments   Labs (all labs ordered are listed, but only abnormal results are displayed) Labs Reviewed  CBC - Abnormal; Notable for the following components:      Result Value   WBC 35.6 (*)    RDW 17.2 (*)    All other components within normal limits  COMPREHENSIVE METABOLIC PANEL  LIPASE, BLOOD  I-STAT ARTERIAL BLOOD GAS, ED    EKG EKG  Interpretation  Date/Time:  Thursday February 23 2021 14:54:21 EDT Ventricular Rate:  117 PR Interval:  193 QRS Duration: 103 QT Interval:  314 QTC Calculation: 419 R Axis:   39 Text Interpretation: Sinus tachycardia Ventricular tachycardia, unsustained Abnormal lateral Q waves Junctional ST depression Confirmed by Pattricia Boss [phone]) on 02/14/2021 3:46:04 PM  Radiology DG Chest Port 1 View  Result Date: 02/17/2021 CLINICAL DATA:  Altered mental status, possible aspiration EXAM: PORTABLE CHEST 1 VIEW COMPARISON:  Chest radiograph 09/04/2019 FINDINGS: The heart is mildly enlarged, unchanged. The mediastinal contours are stable. Lung volumes are low. Patchy opacities in the lung bases could reflect aspiration given the history. There is no other focal consolidation. There is no pleural effusion. There is no pneumothorax. There is no acute osseous abnormality. IMPRESSION: Patchy opacities in the lung bases could reflect aspiration given the history. Electronically Signed   By: Valetta Mole M.D.   On: 02/03/2021 16:00    Procedures Procedures   Medications Ordered in ED Medications  sodium chloride 0.9 % bolus 500 mL (500 mLs Intravenous New Bag/Given 02/14/2021 1608)  morphine 2 MG/ML injection 2 mg (2 mg Intravenous Given 02/01/2021 1603)    ED Course  I have reviewed the triage vital signs and the nursing notes.  Pertinent labs & imaging results that were available during my care of the patient were reviewed by me and considered in my medical decision making (see chart for details). I called his wife, Coy Rochford.  She is the person that is making decisions regarding his care.  She states that she wants him to be comfort care only.  I explained the acuity of his condition and that he may not survive very long.  She is coming to the hospital right now. Reviewed x-Itzy Adler and consistent with aspiration pneumonia.  However patient's respiratory status seems somewhat improved at this time.   His wife is at the bedside.  His sister-in-law is at the bedside.  They expressed wishes for palliative care.  I have explained to them what this means.  His wife is tearful but states that he would not wish to have rest of interventions. Palliative care is coming to Discussed with family. No antibiotics given at this time. 4:29 PM Patient reexamined and appears to have any less respiratory distress with decreased stridor and wheezing.  Sats remained at 93% on nasal cannula. Abdomen is distended and softer with palpation. Care discussed with Dr. Tamera Punt and she has assumed care.  We are waiting palliative care consult and patient will be admitted for comfort care MDM Rules/Calculators/A&P                          Final Clinical Impression(s) / ED Diagnoses Final diagnoses:  Aspiration pneumonia, unspecified aspiration pneumonia type, unspecified laterality, unspecified part of lung [facility])  Admission for palliative care  DNR (do not resuscitate)    Rx / DC Orders ED Discharge Orders     None        Pattricia Boss, MD 02/14/2021 1651

## 2021-02-23 NOTE — Progress Notes (Addendum)
   Subjective: No acute overnight events. Patient was seen at bedside during rounds today.    He was sleeping comfortably in bed. He remains unable to communicate.   Family is updated on the plan for today, and all questions are addressed.   Objective:  Vital signs in last 24 hours: Vitals:   02/14/2021 1645 02/08/2021 1700 02/06/2021 1730 02/04/2021 1745  BP: (!) 168/78  (!) 155/73 139/67  Pulse: (!) 104 (!) 52 (!) 50 100  Resp: 18 (!) 32 18 (!) 21  Temp:      TempSrc:      SpO2: (!) 88% (!) 87% 92% 92%  Weight:      Height:       Constitutional: ill appearing frail elderly man sleeping in bed HENT: normocephalic, atraumatic, Cardiovascular: regular rate and rythmn Pulmonary/Chest: increased work of breathing on [name], lungs sounds coarse bilaterally Neurological: Sleeping, minimal vocalizations Skin: cool and dry   Assessment/Plan:  Active Problems:   Acute hypoxemic respiratory failure (HCC)  Charles Chan is a 80 y.o. male with multiple comorbidities including advanced dementia who is brought to the ED in acute hypoxic respiratory failure 2/2 aspiration pneumonia, and admitted for comfort care.    Advanced dementia, on comfort care  Acute hypoxic respiratory failure Acute hypoxic respiratory failure on presentation in setting of aspiration PNA, in a pt with advanced dementia with poor PO intake and decreased mental alertness. Requiring 4L supplemental O2. Palliative medicine on board. His wife, POA, opting for full comfort care measures. Family anticipating hospital death.  - Continue comfort care and comfort feeds at this time  - Supplemental O2 as needed  - Continue updating pt's wife Charles Chan, his POA.  - Spiritual care  - Palliative medicine following     Best Practice: Diet: Finger foods  IVF: none  VTE: none  Code: DNR/DNI   Charles Manes, MD  Internal Medicine Resident, PGY-1 [facility]  Pager: [phone] After 5pm on  weekdays and 1pm on weekends: On Call pager [phone]

## 2021-02-24 DIAGNOSIS — Z7189 Other specified counseling: Secondary | ICD-10-CM | POA: Diagnosis not present

## 2021-02-24 DIAGNOSIS — I5032 Chronic diastolic (congestive) heart failure: Secondary | ICD-10-CM | POA: Diagnosis present

## 2021-02-24 DIAGNOSIS — Z515 Encounter for palliative care: Secondary | ICD-10-CM | POA: Diagnosis present

## 2021-02-24 DIAGNOSIS — Z66 Do not resuscitate: Secondary | ICD-10-CM | POA: Diagnosis present

## 2021-02-24 DIAGNOSIS — Z20822 Contact with and (suspected) exposure to covid-19: Secondary | ICD-10-CM | POA: Diagnosis present

## 2021-02-24 DIAGNOSIS — F02818 Dementia in other diseases classified elsewhere, unspecified severity, with other behavioral disturbance: Secondary | ICD-10-CM | POA: Diagnosis present

## 2021-02-24 DIAGNOSIS — J449 Chronic obstructive pulmonary disease, unspecified: Secondary | ICD-10-CM | POA: Diagnosis present

## 2021-02-24 DIAGNOSIS — Z87891 Personal history of nicotine dependence: Secondary | ICD-10-CM | POA: Diagnosis not present

## 2021-02-24 DIAGNOSIS — J690 Pneumonitis due to inhalation of food and vomit: Secondary | ICD-10-CM | POA: Diagnosis present

## 2021-02-24 DIAGNOSIS — Z8673 Personal history of transient ischemic attack (TIA), and cerebral infarction without residual deficits: Secondary | ICD-10-CM | POA: Diagnosis not present

## 2021-02-24 DIAGNOSIS — Z825 Family history of asthma and other chronic lower respiratory diseases: Secondary | ICD-10-CM | POA: Diagnosis not present

## 2021-02-24 DIAGNOSIS — Z955 Presence of coronary angioplasty implant and graft: Secondary | ICD-10-CM | POA: Diagnosis not present

## 2021-02-24 DIAGNOSIS — R0682 Tachypnea, not elsewhere classified: Secondary | ICD-10-CM

## 2021-02-24 DIAGNOSIS — N179 Acute kidney failure, unspecified: Secondary | ICD-10-CM | POA: Diagnosis present

## 2021-02-24 DIAGNOSIS — R54 Age-related physical debility: Secondary | ICD-10-CM | POA: Diagnosis present

## 2021-02-24 DIAGNOSIS — J9601 Acute respiratory failure with hypoxia: Secondary | ICD-10-CM | POA: Diagnosis present

## 2021-02-24 DIAGNOSIS — G4733 Obstructive sleep apnea (adult) (pediatric): Secondary | ICD-10-CM | POA: Diagnosis present

## 2021-02-24 DIAGNOSIS — I252 Old myocardial infarction: Secondary | ICD-10-CM | POA: Diagnosis not present

## 2021-02-24 DIAGNOSIS — E785 Hyperlipidemia, unspecified: Secondary | ICD-10-CM | POA: Diagnosis present

## 2021-02-24 DIAGNOSIS — R296 Repeated falls: Secondary | ICD-10-CM | POA: Diagnosis present

## 2021-02-24 DIAGNOSIS — I110 Hypertensive heart disease with heart failure: Secondary | ICD-10-CM | POA: Diagnosis present

## 2021-02-24 DIAGNOSIS — Z8546 Personal history of malignant neoplasm of prostate: Secondary | ICD-10-CM | POA: Diagnosis not present

## 2021-02-24 DIAGNOSIS — N400 Enlarged prostate without lower urinary tract symptoms: Secondary | ICD-10-CM | POA: Diagnosis present

## 2021-02-24 DIAGNOSIS — Z8249 Family history of ischemic heart disease and other diseases of the circulatory system: Secondary | ICD-10-CM | POA: Diagnosis not present

## 2021-02-24 DIAGNOSIS — G301 Alzheimer's disease with late onset: Secondary | ICD-10-CM | POA: Diagnosis present

## 2021-02-24 DIAGNOSIS — I2510 Atherosclerotic heart disease of native coronary artery without angina pectoris: Secondary | ICD-10-CM | POA: Diagnosis present

## 2021-02-24 MED ORDER — FENTANYL 2500MCG IN NS 250ML (10MCG/ML) PREMIX INFUSION
0.0000 ug/h | INTRAVENOUS | Status: DC
  Administered 2021-02-24: 50 ug/h via INTRAVENOUS
  Filled 2021-02-24: qty 250

## 2021-02-24 MED ORDER — FENTANYL BOLUS VIA INFUSION
25.0000 ug | INTRAVENOUS | Status: DC | PRN
  Administered 2021-02-24 (×3): 50 ug via INTRAVENOUS
  Filled 2021-02-24: qty 50

## 2021-02-24 NOTE — Consult Note (Signed)
Consultation Note Date: 02/24/2021   Patient Name: Charles Chan  DOB: 1940-10-05  MRN: [id number]  Age / Sex: 80 y.o., male  PCP: Patient, No Pcp Per (Inactive) Referring Physician: Aldine Contes, MD  Reason for Consultation: Establishing goals of care, "respiratory distress, dnr"  HPI/Patient Profile: 80 y.o. male  with past medical history of CAD, HFpEF, HTN, HLD, OSA not on CPAP, and advanced dementia presented to [facility] ED on 02/26/2021 from [facility] with staff concerns of respiratory distress secondary to aspiration. After discussion with EDP, family express desire for full comfort measures and PMT was consulted. Patient was admitted on 02/07/2021 for terminal care.   Clinical Assessment and Goals of Care: Received page from Dr. Jeanell Sparrow - called and discussed case.   I have reviewed medical records including EPIC notes, labs, and imaging. Received report from primary RN - no acute concerns. Reports patient is lethargic.   Went to visit patient at bedside - wife/Shirley and Irma/sister in law were present. Patient was lying in bed - he does not wake to voice/gentle touch. No signs or non-verbal gestures of pain or discomfort noted. No respiratory distress or secretions; increased work of breathing noted.   Met with patient's wife and SIL  to discuss diagnosis, prognosis, GOC, EOL wishes, disposition, and options.  I introduced Palliative Medicine as specialized medical care for people living with serious illness. It focuses on providing relief from the symptoms and stress of a serious illness. The goal is to improve quality of life for both the patient and the family.  We discussed a brief life review of the patient as well as functional and nutritional status. Patient and his wife have been married since 1979. They did not have any children, but patient did have 2 children from a previous marriage.  Prior to hospitalization, patient was living at [facility] in memory care unit. He recently moved in 1 week ago. Family describe his gradual physical and mental decline, where wife could no longer care for him at home. Patient enjoyed music, his dogs, and dancing.  We discussed patient's current illness and what it means in the larger context of patient's on-going co-morbidities. Family understand that dementia is a progressive, non-curable disease underlying the patient's current acute medical conditions. Natural disease trajectory and expectations at EOL were discussed. I attempted to elicit values and goals of care important to the patient. The difference between aggressive medical intervention and comfort care was considered in light of the patient's goals of care. Family explain that patient would not want his life prolonged in his current state.   We talked about transition to comfort measures in house and what that would entail inclusive of medications to control pain, dyspnea, agitation, nausea, and itching. We discussed stopping all unnecessary measures such as blood draws, needle sticks, oxygen, antibiotics, CBGs/insulin, cardiac monitoring, IVF, and frequent vital signs. Family are agreeable to full comfort care in house. They would like to continue oxygen overnight as other family would like to come visit.  We  briefly discussed transfer to residential hospice facility - family wish for patient to remain in house for EOL care at this time.   Education provided on current [facility] visitation policy. Encouraged family/friends to visit as desired.   Offered chaplain services - they were agreeable and appreciative.   Visit also consisted of discussions dealing with the complex and emotionally intense issues of symptom management and palliative care in the setting of serious and life-threatening illness. Palliative care team will continue to support patient, patient's family, and  medical team.  Discussed with family the importance of continued conversation with each other and the medical providers regarding overall plan of care and treatment options, ensuring decisions are within the context of the patient's values and GOCs.    Questions and concerns were addressed. The patient/family was encouraged to call with questions and/or concerns. PMT card was provided.    Primary Decision Maker: NEXT OF KIN - wife/Shirley Dyal    Recommendations/Plan Initiated full comfort measures Continue DNR/DNI as previously documented Family are not interested in transfer to residential hospice facility at this time - anticipate hospital death Added orders for EOL symptom management and to reflect full comfort measures, as well as discontinued orders that were not focused on comfort Family wish to continue oxygen (without escalation) until tomorrow - they are hopeful other family can visit tonight Chaplain notified and consult placed for: family's request for prayer and emotional support Unrestricted visitation orders were placed per current [place] EOL visitation policy  Nursing to provide frequent assessments and administer PRN medications as clinically necessary to ensure EOL comfort PMT will continue to follow and support holistically   Code Status/Advance Care Planning: DNR  Palliative Prophylaxis:  Aspiration, Bowel Regimen, Delirium Protocol, Frequent Pain Assessment, Oral Care, and Turn Reposition  Additional Recommendations (Limitations, Scope, Preferences): Full Comfort Care  Psycho-social/Spiritual:  Desire for further Chaplaincy support:yes Created space and opportunity for patient and family to express thoughts and feelings regarding patient's current medical situation.  Emotional support and therapeutic listening provided.  Prognosis:  Hours - Days  Discharge Planning: Anticipated Hospital Death      Primary Diagnoses: Present on  Admission:  Acute hypoxemic respiratory failure ([place])  Aspiration pneumonia ([place])   I have reviewed the medical record, interviewed the patient and family, and examined the patient. The following aspects are pertinent.  Past Medical History:  Diagnosis Date   Ambulates with cane 11/22/2016   In the 90s, patient injured his knee (ligament tear?) and declined surgery. Since then he has been using a cane to ambulate. Reports he is not in pain.    CAD (coronary artery disease) 11/22/2016   With Stent in 1998   CHF (congestive heart failure) ([place]) 11/22/2016   Diagnosed in 1998   Dementia ([place]) 11/23/2016   Former smoker 11/22/2016   Quit 1980s   H/O partial resection of colon 11/22/2016   Due to polyp in 2004    History of alcohol abuse 11/22/2016   1970s   History of MI (myocardial infarction) 11/22/2016   2001   History of prostate cancer 11/22/2016   S/p radiation in 2015? Was followed by previous oncologist yearly (Dr. Nona Dell in [place])    History of stroke 11/22/2016   HLD (hyperlipidemia) 11/22/2016   HTN (hypertension) 11/22/2016   Irregular cardiac rhythm 11/22/2016   OSA (obstructive sleep apnea) 11/22/2016   History of OSA. Has not required CPAP since May 2018   Social History   Socioeconomic  History   Marital status: Married    Spouse name: Not on file   Number of children: Not on file   Years of education: Not on file   Highest education level: Not on file  Occupational History   Not on file  Tobacco Use   Smoking status: Former    Types: Cigarettes    Quit date: 1980    Years since quitting: 42.8   Smokeless tobacco: Never  Vaping Use   Vaping Use: Never used  Substance and Sexual Activity   Alcohol use: No   Drug use: No   Sexual activity: Not on file  Other Topics Concern   Not on file  Social History Narrative   Ambulates with a cane.    Social Determinants of Health   Financial Resource Strain: Not on file  Food Insecurity: Not on file  Transportation  Needs: Not on file  Physical Activity: Not on file  Stress: Not on file  Social Connections: Not on file   Family History  Problem Relation Age of Onset   Hyperlipidemia Mother    Hypertension Mother    Alcohol abuse Father    Drug abuse Father    Heart disease Father        passed away in his 62s due to MI   Asthma Sister    Healthy Brother    Scheduled Meds:  antiseptic oral rinse  15 mL Mouth Rinse BID   Continuous Infusions:  fentaNYL infusion INTRAVENOUS 50 mcg/hr (02/24/21 1450)   PRN Meds:.acetaminophen **OR** acetaminophen, fentaNYL, glycopyrrolate **OR** glycopyrrolate **OR** glycopyrrolate, haloperidol **OR** haloperidol **OR** haloperidol lactate, loperamide, LORazepam **OR** LORazepam **OR** LORazepam, ondansetron **OR** ondansetron (ZOFRAN) IV, polyvinyl alcohol Medications Prior to Admission:  Prior to Admission medications   Medication Sig Start Date End Date Taking? Authorizing Provider  albuterol (PROVENTIL) (2.5 MG/3ML) 0.083% nebulizer solution Take 2.5 mg by nebulization at bedtime.  07/23/18  Yes [provider]  amLODipine (NORVASC) 10 MG tablet TAKE 1 TABLET BY MOUTH ONCE DAILY Patient taking differently: Take 10 mg by mouth daily. 06/30/18  Yes Myles Gip, DO  aspirin 81 MG chewable tablet Take 81 mg by mouth daily.    Yes [provider]  atorvastatin (LIPITOR) 40 MG tablet Take 40 mg by mouth daily. 11/21/18  Yes [provider]  ferrous sulfate 325 (65 FE) MG tablet Take 1 tablet by mouth daily with breakfast. 08/30/20  Yes [provider]  furosemide (LASIX) 20 MG tablet Take 1 tablet by mouth daily. 03/30/20  Yes [provider]  metoprolol succinate (TOPROL-XL) 25 MG 24 hr tablet Take 1 tablet (25 mg total) by mouth daily. 08/30/17  Yes Dickie La, MD  nitroGLYCERIN (NITROSTAT) 0.4 MG SL tablet Place 1 tablet (0.4 mg total) under the tongue every 5 (five) minutes as needed for chest pain. 09/02/17  Yes  Smiley Houseman, MD  pantoprazole (PROTONIX) 40 MG tablet Take 40 mg by mouth daily. 03/30/20  Yes [provider]  tamsulosin (FLOMAX) 0.4 MG CAPS capsule Take 0.4 mg by mouth daily.   Yes [provider]  umeclidinium-vilanterol (ANORO ELLIPTA) 62.5-25 MCG/INH AEPB Inhale 1 puff into the lungs daily at 6 PM. 12/01/19  Yes [provider]  ELIQUIS 5 MG TABS tablet Take 5 mg by mouth 2 (two) times daily. Patient not taking: No sig reported 09/06/18   [provider]  galantamine (RAZADYNE ER) 8 MG 24 hr capsule Take by mouth. 08/11/20 11/09/20  [provider]  mometasone-formoterol (DULERA) 100-5 MCG/ACT AERO Inhale 2 puffs into the lungs 2 (two) times daily. Patient not taking: No sig reported 09/26/18   Eugenie Filler, MD  rivaroxaban (XARELTO) 20 MG TABS tablet Take 20 mg by mouth daily. Patient not taking: No sig reported 09/14/19   [provider]  simvastatin (ZOCOR) 20 MG tablet Take 1 tablet (20 mg total) by mouth daily at 6 PM. Patient not taking: No sig reported 11/14/17   Myles Gip, DO   Allergies  Allergen Reactions   Shellfish Allergy Hives   Quetiapine Anxiety   Review of Systems  Unable to perform ROS: Acuity of condition   Physical Exam Vitals and nursing note reviewed.  Constitutional:      General: He is not in acute distress.    Appearance: He is ill-appearing.  Pulmonary:     Effort: No respiratory distress.     Comments: Increased work of breathing Skin:    General: Skin is warm and dry.  Neurological:     Mental Status: He is unresponsive.     Motor: Weakness present.  Psychiatric:        Speech: He is noncommunicative.    Vital Signs: BP (!) 158/64 (BP Location: Right Arm)   Pulse 100   Temp 98.6 F (37 C) (Oral)   Resp (!) 26   Ht _0  (1.803 m)   Wt 80.5 kg   SpO2 90%   BMI 24.75 kg/m  Pain Scale: PAINAD   Pain Score: Asleep   SpO2: SpO2: 90 % O2 Device:SpO2: 90 % O2 Flow  Rate: .O2 Flow Rate (L/min): 4 L/min  IO: Intake/output summary:  Intake/Output Summary (Last 24 hours) at 02/24/2021 1616 Last data filed at 02/24/2021 1500 Gross per 24 hour  Intake 500.81 ml  Output --  Net 500.81 ml    LBM:   Baseline Weight: Weight: 84 kg Most recent weight: Weight: 80.5 kg     Palliative Assessment/Data: PPS 10%     Time In: 1730 Time Out: 1845 Time Total: 75 minutes  Greater than 50%  of this time was spent counseling and coordinating care related to the above assessment and plan.  Signed by: Lin Landsman, NP   Please contact Palliative Medicine Team phone at [phone] for questions and concerns.  For individual provider: See Shea Evans

## 2021-02-24 NOTE — Progress Notes (Signed)
Daily Progress Note   Patient Name: Charles Chan       Date: 02/24/2021 DOB: 06/20/1940  Age: 80 y.o. MRN#: [id number] Attending Physician: Aldine Contes, MD Primary Care Physician: Patient, No Pcp Per (Inactive) Admit Date: 02/26/2021  Reason for Consultation/Follow-up: Non pain symptom management, Pain control, Psychosocial/spiritual support, and Terminal Care  Subjective: Chart review performed. Received report from primary RN - no acute concerns. Reports patient remains lethargic, not eating/drinking.  Went to visit patient at bedside - wife/Shirley and SIL/Irma present. Patient was lying in bed - he does not wake to voice/gentle touch. No signs or non-verbal gestures of pain or discomfort noted. No respiratory distress or secretions;  increased work of breathing and tachypnea noted.   Emotional support provided to family. Natural trajectory at EOL was reviewed again per their request. Discussed symptom management plan to include initiating fentanyl drip to better manage patient's work of breathing - family agreeable. Also discussed weaning oxygen - family are agreeable to begin weaning to RA today.    "Gone From My Sight" book provided.  Reviewed with RN plan to wean oxygen and symptom management plan.    Length of Stay: 0  Current Medications: Scheduled Meds:   antiseptic oral rinse  15 mL Mouth Rinse BID    Continuous Infusions:   PRN Meds: acetaminophen **OR** acetaminophen, fentaNYL (SUBLIMAZE) injection, glycopyrrolate **OR** glycopyrrolate **OR** glycopyrrolate, haloperidol **OR** haloperidol **OR** haloperidol lactate, loperamide, LORazepam **OR** LORazepam **OR** LORazepam, ondansetron **OR** ondansetron (ZOFRAN) IV, polyvinyl alcohol  Physical Exam Vitals  and nursing note reviewed.  Constitutional:      General: He is not in acute distress.    Appearance: He is ill-appearing.  Pulmonary:     Effort: Tachypnea present. No respiratory distress.     Comments: Increased work of breathing Skin:    General: Skin is warm and dry.  Neurological:     Mental Status: He is unresponsive.     Motor: Weakness present.  Psychiatric:        Speech: He is noncommunicative.            Vital Signs: BP (!) 158/64 (BP Location: Right Arm)   Pulse 100   Temp 98.6 F (37 C) (Oral)   Resp (!) 26   Ht 5\' 11"  (1.803 m)   Wt 80.5 kg  SpO2 90%   BMI 24.75 kg/m  SpO2: SpO2: 90 % O2 Device: O2 Device: Nasal Cannula O2 Flow Rate: O2 Flow Rate (L/min): 4 L/min  Intake/output summary:  Intake/Output Summary (Last 24 hours) at 02/24/2021 1218 Last data filed at 02/05/2021 1702 Gross per 24 hour  Intake 500 ml  Output --  Net 500 ml   LBM:   Baseline Weight: Weight: 84 kg Most recent weight: Weight: 80.5 kg       Palliative Assessment/Data: PPS 10%      Patient Active Problem List   Diagnosis Date Noted   Aspiration pneumonia ([place]) 02/24/2021   Acute hypoxemic respiratory failure ([place]) 02/07/2021   Coagulation defect ([place]) 10/07/2020   Acquired phimosis of penis 10/25/2019   GI bleed 09/04/2019   ILD (interstitial lung disease) ([place]) 07/30/2019   Late onset Alzheimer's disease with behavioral disturbance ([place]) 06/01/2019   Malignant neoplasm of prostate ([place]) 10/26/2018   CAP (community acquired pneumonia) 09/22/2018   SBO (small bowel obstruction) ([place]) 09/22/2018   COPD with chronic bronchitis ([place]) 09/22/2018   Nausea & vomiting 09/22/2018   Dehydration 09/22/2018   Elevated troponin 09/22/2018   Nonrheumatic aortic valve insufficiency 02/11/2018   BPH (benign prostatic hyperplasia) 07/03/2017   Diverticulosis of colon 04/09/2017   Internal hemorrhoids 04/09/2017   Atrial flutter ([place]) 11/26/2016   Dementia ([place]) 11/23/2016    Ambulates with cane 11/22/2016   History of stroke 11/22/2016   OSA (obstructive sleep apnea) 11/22/2016   HTN (hypertension) 11/22/2016   HLD (hyperlipidemia) 11/22/2016   History of MI (myocardial infarction) 11/22/2016   CHF (congestive heart failure) ([place]) 11/22/2016   History of prostate cancer 11/22/2016   History of alcohol abuse 11/22/2016   CAD (coronary artery disease) 11/22/2016   H/O partial resection of colon 11/22/2016   Former smoker 11/22/2016   Irregular cardiac rhythm 11/22/2016    Palliative Care Assessment & Plan   Patient Profile: 80 y.o. male  with past medical history of CAD, HFpEF, HTN, HLD, OSA not on CPAP, and advanced dementia presented to [facility] ED on 02/19/2021 from [facility] with staff concerns of respiratory distress secondary to aspiration. After discussion with EDP, family express desire for full comfort measures and PMT was consulted. Patient was admitted on 02/24/2021 for terminal care.  Assessment: Dementia Aspiration pneumonia Terminal care  Recommendations/Plan: Continue full comfort measures Continue DNR/DNI as previously documented Anticipate hospital death Initiated fentanyl drip for tachypnea, increased work of breathing, and anticipation of additional symptom needs as oxygen is weaned Wean to room air Nursing to provide frequent assessments and administer PRN medications as clinically necessary to ensure EOL comfort PMT will continue to follow and support holistically  Goals of Care and Additional Recommendations: Limitations on Scope of Treatment: Full Comfort Care  Code Status:    Code Status Orders  (From admission, onward)           Start     Ordered   02/17/2021 1803  Do not attempt resuscitation (DNR)  Continuous       Question Answer Comment  In the event of cardiac or respiratory ARREST Do not call a "code blue"   In the event of cardiac or respiratory ARREST Do not perform Intubation, CPR, defibrillation or ACLS   In  the event of cardiac or respiratory ARREST Use medication by any route, position, wound care, and other measures to relive pain and suffering. May use oxygen, suction and manual treatment of airway obstruction as needed for comfort.  02/04/2021 1804           Code Status History     Date Active Date Inactive Code Status Order ID Comments User Context   02/12/2021 1802 02/05/2021 1804 DNR [id number]  Iona Beard, MD ED   09/22/2018 2253 09/26/2018 1806 DNR [id number]  Toy Baker, MD Inpatient   03/24/2017 2103 03/27/2017 1958 Partial Code [id number]  Tonette Bihari, MD Inpatient   03/24/2017 1929 03/24/2017 2102 Full Code [id number]  Tonette Bihari, MD Inpatient       Prognosis:  Hours - Days  Discharge Planning: Anticipated Hospital Death  Care plan was discussed with primary RN, patient's family  Thank you for allowing the Palliative Medicine Team to assist in the care of this patient.   Total Time 40 minutes Prolonged Time Billed  no       Greater than 50%  of this time was spent counseling and coordinating care related to the above assessment and plan.  Lin Landsman, NP  Please contact Palliative Medicine Team phone at [phone] for questions and concerns.

## 2021-02-24 NOTE — Progress Notes (Cosign Needed)
Pt's family requested water for pt. Provided oral swabs and demonstrated proper technique to family to prevent further aspiration. Pt's family verbalized understanding.

## 2021-02-24 NOTE — Progress Notes (Signed)
New Admission Note:   Arrival Method: from ED via [facility] Orientation: respond to voice/pain Telemetry: N/A Assessment: Completed Skin: Intact, bump on hi mid upper back IV: right Hand, nsl Pain:  Tubes: None Safety Measures: Safety Fall Prevention Plan has been discussed with family Admission: to be completed 5 [place] Orientation: Patient/family  has been oriented to the room, unit and staff.   Family: wife and sister in law at bedside  Orders to be reviewed and implemented. Will continue to monitor the patient. Call light has been placed within reach and bed alarm has been activated.

## 2021-02-24 NOTE — Plan of Care (Signed)
  Problem: Education: Goal: Knowledge of General Education information will improve Description: Including pain rating scale, medication(s)/side effects and non-pharmacologic comfort measures Outcome: Progressing   Problem: Pain Managment: Goal: General experience of comfort will improve Outcome: Progressing   

## 2021-02-24 NOTE — Progress Notes (Signed)
This chaplain attempted F/U EOL spiritual care referral from [facility].  The chaplain understands the Pt. family has left the bedside and will return. The chaplain is appreciative of RN-Andrea's update.  Chaplain Sallyanne Kuster [phone]

## 2021-02-24 NOTE — TOC Initial Note (Signed)
Transition of Care [facility]) - Initial/Assessment Note    Patient Details  Name: Charles Chan MRN: [id number] Date of Birth: 10/14/1940  Transition [facility]) CM/SW Contact:    Tom-Johnson, Renea Ee, RN Phone Number: 02/24/2021, 2:20 PM  Clinical Narrative:                 CM spoke with patient's wife, Stratton Villwock at bedside. Patient lying comfortably in bed, non verbal at this time. Enid Derry states she and patient moved from [place], [place] couple of years ago and patient was doing well until recently when patient had to be moved to [facility] ALF. States patient has two children who are not supportive of his care. They are not Shirley's children. 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 Patient is now on comfort care with expected hospital death per MD. Palliative care and Chaplain went in room to talk with wife. Shirley's sister, Benay Spice and Irma's son Arnell Sieving are at bedside as well. Enid Derry denies any needs at this time. CM encouraged her to call if there is a need. CM will continue to follow.      Barriers to Discharge: Continued Medical Work up   Patient Goals and CMS Choice Patient states their goals for this hospitalization and ongoing recovery are:: Comfort care CMS Medicare.gov Compare Post Acute Care list provided to:: Patient Represenative (must comment) (Wife) Choice offered to / list presented to : NA  Expected Discharge Plan and Services     Discharge Planning Services: CM Consult Post Acute Care Choice: Hospice Living arrangements for the past 2 months: [place]                 DME Arranged: N/A DME Agency: NA       HH Arranged: NA HH Agency: NA        Prior Living Arrangements/Services Living arrangements for the past 2 months: [place] Lives with:: Facility Resident Patient language and need for interpreter reviewed:: Yes Do you feel safe going back to the place where  you live?: Yes      Need for Family Participation in Patient Care: Yes (Comment) Care giver support system in place?: Yes (comment) Current home services: Other (comment) Criminal Activity/Legal Involvement Pertinent to Current Situation/Hospitalization: No - Comment as needed  Activities of Daily Living      Permission Sought/Granted Permission sought to share information with : Case Manager, Family Supports Permission granted to share information with : Yes, Verbal Permission Granted              Emotional Assessment Appearance:: Appears stated age Attitude/Demeanor/Rapport: Unable to Assess (Not communicating) Affect (typically observed): Unable to Assess Orientation: : Oriented to Self Alcohol / Substance Use: Not Applicable Psych Involvement: No (comment)  Admission diagnosis:  Admission for palliative care [Z51.5] DNR (do not resuscitate) [Z66] Acute hypoxemic respiratory failure (HCC) [J96.01] Aspiration pneumonia, unspecified aspiration pneumonia type, unspecified laterality, unspecified part of lung ([place]) [J69.0] Aspiration pneumonia ([place]) [J69.0] Patient Active Problem List   Diagnosis Date Noted   Aspiration pneumonia ([place]) 02/24/2021   Acute hypoxemic respiratory failure ([place]) 02/21/2021   Coagulation defect ([place]) 10/07/2020   Acquired phimosis of penis 10/25/2019   GI bleed 09/04/2019   ILD (interstitial lung disease) ([place]) 07/30/2019   Late onset Alzheimer's disease with behavioral disturbance ([place]) 06/01/2019   Malignant neoplasm of prostate ([place]) 10/26/2018   CAP (community acquired pneumonia) 09/22/2018   SBO (small  bowel obstruction) ([place]) 09/22/2018   COPD with chronic bronchitis ([place]) 09/22/2018   Nausea & vomiting 09/22/2018   Dehydration 09/22/2018   Elevated troponin 09/22/2018   Nonrheumatic aortic valve insufficiency 02/11/2018   BPH (benign prostatic hyperplasia) 07/03/2017   Diverticulosis of colon 04/09/2017   Internal hemorrhoids  04/09/2017   Atrial flutter ([place]) 11/26/2016   Dementia ([place]) 11/23/2016   Ambulates with cane 11/22/2016   History of stroke 11/22/2016   OSA (obstructive sleep apnea) 11/22/2016   HTN (hypertension) 11/22/2016   HLD (hyperlipidemia) 11/22/2016   History of MI (myocardial infarction) 11/22/2016   CHF (congestive heart failure) ([place]) 11/22/2016   History of prostate cancer 11/22/2016   History of alcohol abuse 11/22/2016   CAD (coronary artery disease) 11/22/2016   H/O partial resection of colon 11/22/2016   Former smoker 11/22/2016   Irregular cardiac rhythm 11/22/2016   PCP:  Patient, No Pcp Per (Inactive) Pharmacy:   [place], [address] [place] [address] Phone: [phone] Fax: [phone]     Social Determinants of Health (SDOH) Interventions    Readmission Risk Interventions No flowsheet data found.

## 2021-02-24 NOTE — Progress Notes (Signed)
This chaplain is appreciative of RN-Andrea updating the chaplain of the Pt. family arrival at the bedside. The family shares their disbelief how quickly the Pt. health declined, but also accepts a spiritual presence in the unknown.   Pt. sister in law-Irma and wife-Shirley accepted the chaplain visit.  In the time together, the chaplain listened reflectively to the family talk about the Pt. role in family and his career as a Biomedical scientist.  The chaplain understands the Pt. has a quiet nature and enjoys watching life from the porch in his later years.  The Pt. often shared a wave with the people passing by.  The Pt. family accepted the chaplain's invitation for prayer and F/U spiritual care.  Chaplain Sallyanne Kuster [phone]

## 2021-02-24 NOTE — Progress Notes (Signed)
  Date: 02/24/2021  Patient name: Charles Chan  Medical record number: [id number]  Date of birth: 05-31-1940   I have seen and evaluated Charles Chan and discussed their care with the Residency Team.  In brief, patient is a 80 year old male with past medical history of CAD, chronic diastolic heart failure, hypertension, hyperlipidemia, OSA and advanced dementia who presented to the ED with acute hypoxic respiratory failure from ALF.  Patient was reported to have increased shortness of breath at his ALF secondary to aspiration during swallowing test.  On presentation here patient was noted to have an elevated white count of 35.6 as well as an elevated creatinine of 10.1 from a baseline of 1.  His chest x-ray did show evidence of aspiration pneumonia.  There is extensive discussion with the patient's family by the ED provider and the decision was made to make the patient comfort care.  PMHx, Fam Hx, and/or Soc Hx : As per resident admit note  Vitals:   02/21/2021 2215 02/24/21 0443  BP: 128/77 (!) 158/64  Pulse: (!) 46 100  Resp: (!) 22 (!) 26  Temp:  98.6 F (37 C)  SpO2: 95% 90%   General: Unresponsive to voice, NAD CVS: Regular rate and rhythm, normal heart sounds Lungs: Coarse lung sounds bilaterally Abdomen: Soft, nontender, nondistended, normoactive bowel sounds Extremities: No edema noted, nontender to palpation Neuro: Unresponsive to voice Skin: Cool and dry HEENT: Normocephalic, traumatic  Assessment and Plan: I have seen and evaluated the patient as outlined above. I agree with the formulated Assessment and Plan as detailed in the residents' note, with the following changes:   1.  Acute hypoxic respiratory failure: -Patient presented to ED with acute hypoxic respiratory failure likely secondary to aspiration pneumonia after failing a swallow test at his ALF.  Decision was made by family to make the patient comfort care at this time -We will continue with comfort  care measures including supplemental O2 as needed -Spiritual care following the patient -Palliative care follow-up and recommendations appreciated -Patient is DNR/DNI -We will continue with oxygen for now per family request -We will continue with fentanyl drip given increased work of breathing -No further work-up at this time.  I suspect patient will die in the hospital in the next 1 to 2 days.  Aldine Contes, MD 10/28/20224:36 PM

## 2021-02-25 DIAGNOSIS — J690 Pneumonitis due to inhalation of food and vomit: Secondary | ICD-10-CM | POA: Diagnosis not present

## 2021-02-25 DIAGNOSIS — Z66 Do not resuscitate: Secondary | ICD-10-CM | POA: Diagnosis not present

## 2021-02-25 DIAGNOSIS — J9601 Acute respiratory failure with hypoxia: Secondary | ICD-10-CM | POA: Diagnosis not present

## 2021-02-25 DIAGNOSIS — Z515 Encounter for palliative care: Secondary | ICD-10-CM | POA: Diagnosis not present

## 2021-02-25 MED ORDER — GLYCOPYRROLATE 0.2 MG/ML IJ SOLN
0.4000 mg | INTRAMUSCULAR | Status: AC
  Administered 2021-02-25: 0.4 mg via INTRAVENOUS
  Filled 2021-02-25: qty 2

## 2021-02-25 MED ORDER — GLYCOPYRROLATE 1 MG PO TABS: 1.0000 mg | ORAL_TABLET | ORAL | Status: DC | PRN

## 2021-02-25 MED ORDER — GLYCOPYRROLATE 0.2 MG/ML IJ SOLN: 0.2000 mg | INTRAMUSCULAR | Status: DC | PRN

## 2021-02-25 MED ORDER — GLYCOPYRROLATE 0.2 MG/ML IJ SOLN: 0.4000 mg | INTRAMUSCULAR | Status: DC | PRN

## 2021-02-28 NOTE — Progress Notes (Signed)
Funeral home here to pick up pt. All lines were removed.   Charles Chan

## 2021-02-28 NOTE — Progress Notes (Signed)
Daily Progress Note   Patient Name: Charles Chan       Date: Mar 22, 2021 DOB: 06-08-1940  Age: 80 y.o. MRN#: [id number] Attending Physician: Sid Falcon, MD Primary Care Physician: Patient, No Pcp Per (Inactive) Admit Date: 02/13/2021  Reason for Consultation/Follow-up: end of life care, symptom management  Subjective: Patient appears uncomfortable. Unresponsive to voice and light touch. Respiratory rate appears increased and respirations are labored. Excessive respiratory secretions noted. Fentanyl infusion is at 50 mcg/hr. I administered a bolus dose (via the infusion) of 25 mcg over 2 minutes. I returned to the room 10-15 minutes later and patient still appeared uncomfortable. I administered an additional bolus dose of fentanyl 25 mcg over 2 minutes.   I spoke with bedside RN and asked her to administer a dose of IV robinul and increase fentanyl infusion rate to 50 mcg/hr.    Length of Stay: 1  Current Medications: Scheduled Meds:   antiseptic oral rinse  15 mL Mouth Rinse BID    Continuous Infusions:  fentaNYL infusion INTRAVENOUS 50 mcg/hr (02/24/21 1450)    PRN Meds: acetaminophen **OR** acetaminophen, fentaNYL, glycopyrrolate **OR** glycopyrrolate **OR** glycopyrrolate, haloperidol **OR** haloperidol **OR** haloperidol lactate, loperamide, LORazepam **OR** LORazepam **OR** LORazepam, ondansetron **OR** ondansetron (ZOFRAN) IV, polyvinyl alcohol       Vital Signs: BP 137/63 (BP Location: Right Arm)   Pulse (!) 108   Temp 98 F (36.7 C) (Oral)   Resp (!) 28   Ht 5\' 11"  (1.803 m)   Wt 80.5 kg   SpO2 (!) 88%   BMI 24.75 kg/m  SpO2: SpO2: (!) 88 % O2 Device: O2 Device: Nasal Cannula O2 Flow Rate: O2 Flow Rate (L/min): 4 L/min   LBM:   Baseline Weight:  Weight: 84 kg Most recent weight: Weight: 80.5 kg       Palliative Assessment/Data: PPS 10%     Palliative Care Assessment & Plan   Patient Profile: 80 y.o. male  with past medical history of CAD, HFpEF, HTN, HLD, OSA not on CPAP, and advanced dementia presented to [facility] ED on 02/24/2021 from [facility] with staff concerns of respiratory distress secondary to aspiration. After discussion with EDP, family express desire for full comfort measures and PMT was consulted. Patient was admitted on 02/21/2021 for terminal care.   Assessment: Dementia Aspiration pneumonia Terminal  care  Recommendations/Plan: Continue full comfort care Robinul 0.4 mg IV now x 1 dose Increase fentanyl infusion to 50 mcg/hr Continue to administer fentanyl bolus doses as needed for comfort   Code Status: DNR/DNI   Prognosis:  hours  Discharge Planning: Anticipated Hospital Death   Thank you for allowing the Palliative Medicine Team to assist in the care of this patient.   Total Time 25 minutes Prolonged Time Billed  no       Greater than 50%  of this time was spent counseling and coordinating care related to the above assessment and plan.  Lavena Bullion, NP  Please contact Palliative Medicine Team phone at [phone] for questions and concerns.

## 2021-02-28 NOTE — Progress Notes (Signed)
   Subjective: No acute overnight events. Patient was seen at bedside during rounds today.  Patient sleeping, family at bedside. Spoke with wife and sister in law state patient's pastor will come visit today. Family states patient more agitated overnight but after medication has been comfortable this morning and does not appear in pain.  Family is updated on the plan for today, and all questions are addressed.   Objective:  Vital signs in last 24 hours: Vitals:   01/30/2021 2215 02/24/21 0443 02/24/21 1722 2021-03-08 0439  BP: 128/77 (!) 158/64 (!) 147/72 137/63  Pulse: (!) 46 100 (!) 38 (!) 108  Resp: (!) 22 (!) 26 20 (!) 28  Temp:  98.6 F (37 C) 99 F (37.2 C) 98 F (36.7 C)  TempSrc:  Oral  Oral  SpO2: 95% 90% 91% (!) 88%  Weight:  80.5 kg    Height:       Constitutional: ill appearing frail elderly man sleeping in bed HENT: normocephalic, atraumati Cardiovascular: regular rate and rythmn Pulmonary/Chest: Coarse lung sounds, gurgling respirations Neurological: Sleeping, appears calm Skin: cool and dry   Assessment/Plan:  Active Problems:   Acute hypoxemic respiratory failure (HCC)   Aspiration pneumonia (HCC)  Charles UHDE is a 80 y.o. male with multiple comorbidities including advanced dementia who is brought to the ED in acute hypoxic respiratory failure 2/2 aspiration pneumonia, and admitted for comfort care.    Advanced dementia, on comfort care  Acute hypoxic respiratory failure Acute hypoxic respiratory failure on presentation in setting of aspiration pneumonia. Patient with advance dementia and declining trajectory for the past 2 years.  Remain on 4L supplemental O2. On full comfort care, anticipate in hospital death. Now on fentanyl drip, appears comfortable in bed this morning. - Appreciate  palliative recommendations. - Continue comfort care and comfort feeds at this time  - Supplemental O2 as needed  - Continue updating pt's wife Enid Derry, his POA.  -  Spiritual care  - Palliative medicine following     Best Practice: Diet: Finger foods  IVF: none  VTE: none  Code: DNR/DNI  Iona Beard, MD  Internal Medicine Resident, PGY-2 [facility]  Pager: [phone] After 5pm on weekdays and 1pm on weekends: On Call pager [phone]

## 2021-02-28 NOTE — Death Summary Note (Signed)
  Name: INGRAM ONNEN MRN: [id number] DOB: Nov 19, 1940 80 y.o.  Date of Admission: 02/10/2021  2:48 PM Date of Discharge: 2021-03-27 Attending Physician: Sid Falcon, MD  Discharge Diagnosis: Active Problems:   Acute hypoxemic respiratory failure ([place])   Aspiration pneumonia ([place]) Advanced dementia  Cause of death: Respiratory arrest Time of death: 1550  Disposition and follow-up:   Mr.Marcio L Hocker was discharged from [facility] in expired condition.    Hospital Course: KLAYTEN JOLLIFF is a 80 y.o. male with a pertinent PMH of CAD, HFpEF, HTN, HLD, OSA not on CPAP, and advanced dementia from memory unit at [facility]. Has been a resident at [facility] for 1 week ago. He previously lived at home with his wife, but worsening dementia resulting in frequent falls required assistance in care. He is brought to the [facility] ED because of acute hypoxic respiratory distress. He is reported to have labored breathing at the facility, 2/2 aspiration during a swallowing test at the facility on 02/13/2021.    Patient noted to be in respiratory distress with his WBC is elevated at 35.6, BUN 124, Cr 10.1 (baseline 1). CXR consistent with aspiration pneumonia. After discussion with in the ED given patient's declining trajectory for the past 2 years due to advanced dementia in addition to the acuity of his condition and poor prognosis, and his wife, his POA, expressed that pt would want comfort care measures only. Details of comfort care were explained to the family. It was decided between Dr. Jeanell Sparrow and family at bedside that pt is to be full comfort care. Palliative care was consulted and IMTS was called for admission.   Likely respiratory distress secondary to aspiration PNA, in a pt with advanced dementia with poor PO intake and decreased mental alertness. Patient placed on full comfort measure on admission per wife's wishes with the help of our palliative team. Given severity of  illness planned to have in hospital death. Patient started on fentanyl infusion for pain and respiratory distress. Spiritual care provided. Patient appeared comfortable and continued on comfort. Unrestricted visitation placed to allow for extended family to visit. Patient's family at bedside when he developed respiratory arrest. Time of death as 27 on Mar 27, 2021. Patient paced peacefully in company of family.   Signed: Iona Beard, MD 03-27-2021, 4:32 PM

## 2021-02-28 NOTE — Progress Notes (Signed)
Writer called in by patient's family member, stated "patient is not breathing." Assessed and determined patient was deceased, no heart sounds auscultated and respirations ceased. Second RN Marge verified, time of death 24. IM team notified. Fentanyl 50cc wasted with Marge RN.

## 2021-02-28 DEATH — deceased

## 2021-03-12 IMAGING — CR CHEST - 2 VIEW
2 series · 2 of 2 positions shown · non-contrast
Comparison: Radiographs 03/19/2018 and 03/24/2017.

CLINICAL DATA: Shortness of breath with cough and congestion.
History of prostate cancer, myocardial infarction and dementia.

EXAM:
CHEST - 2 VIEW

[w chest lat]
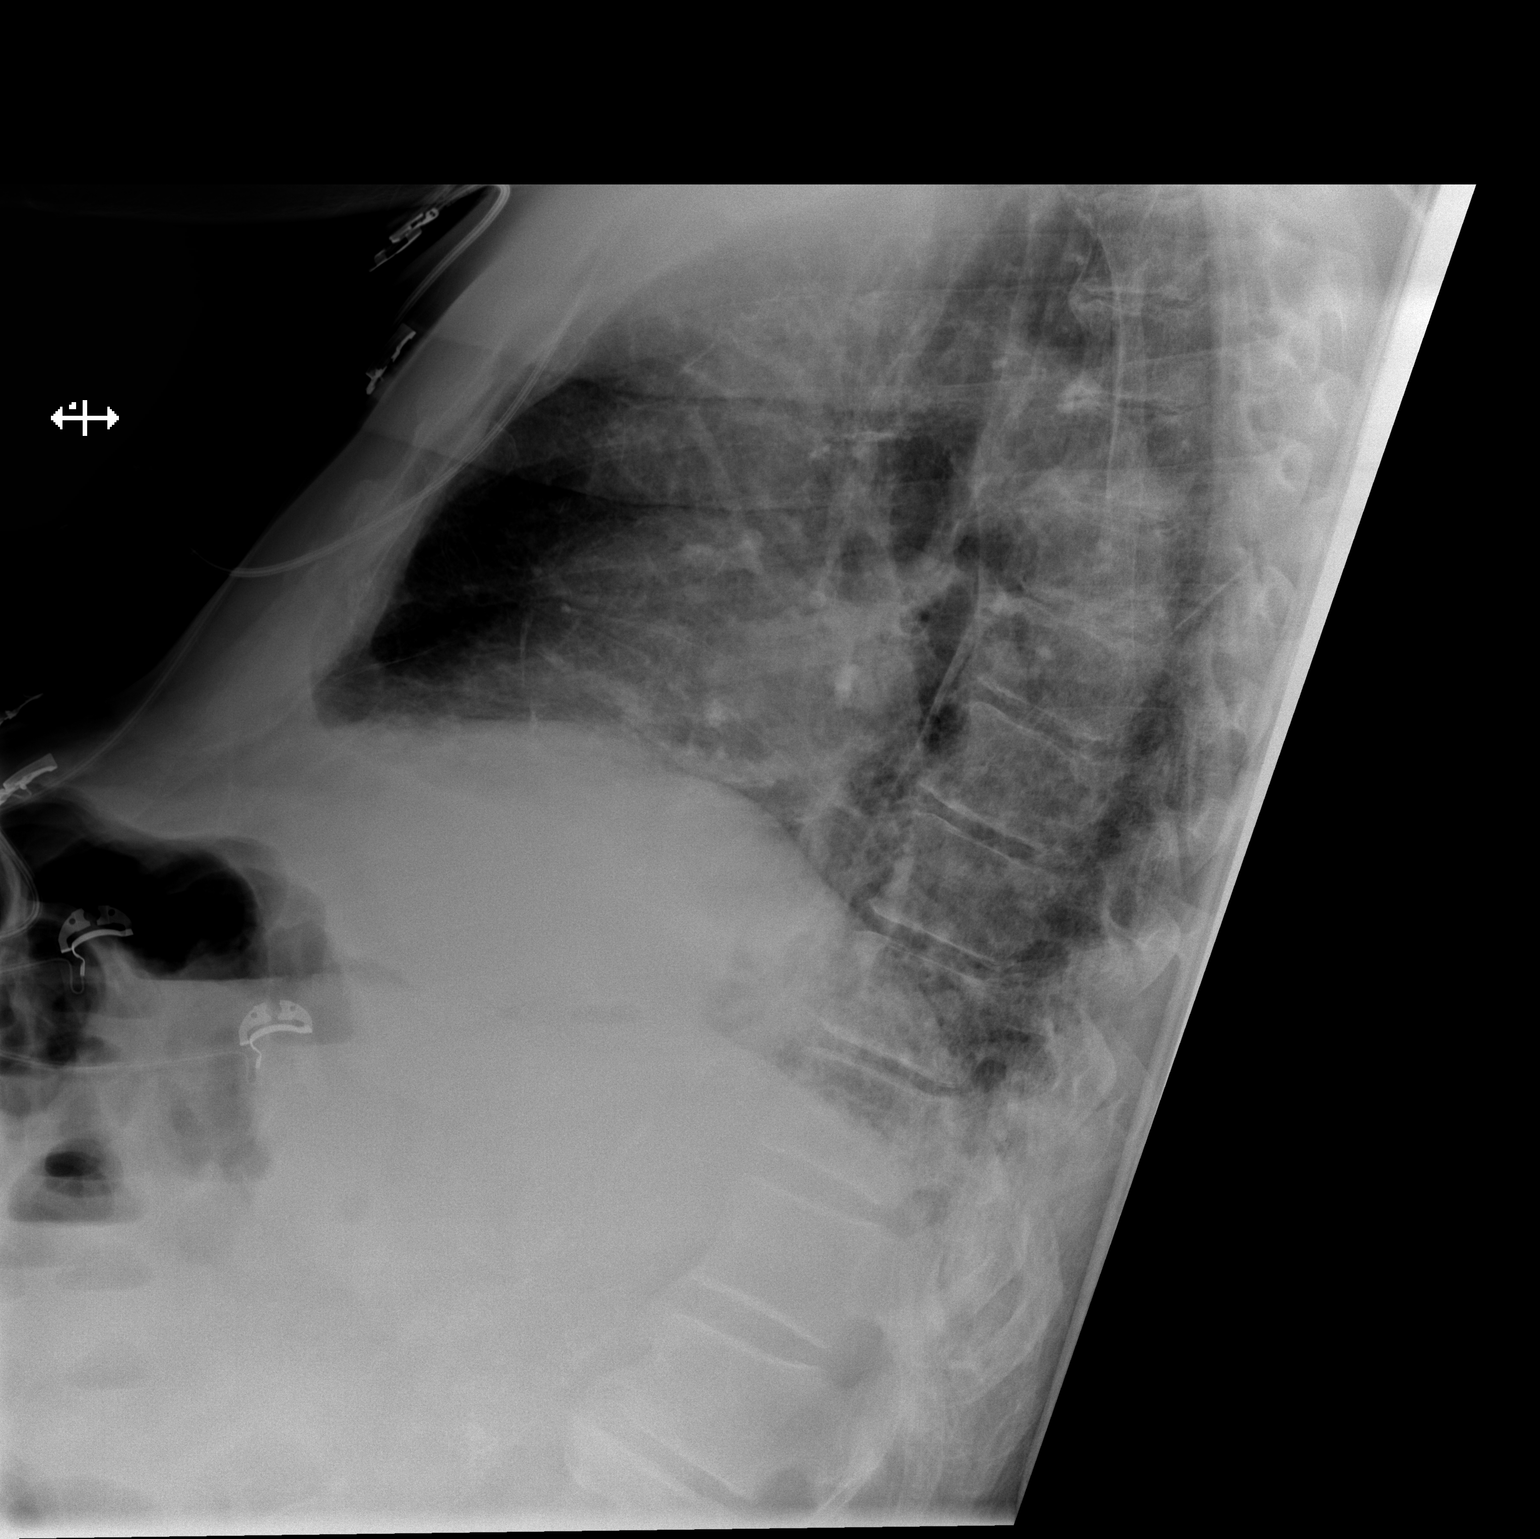

[x chest ap]
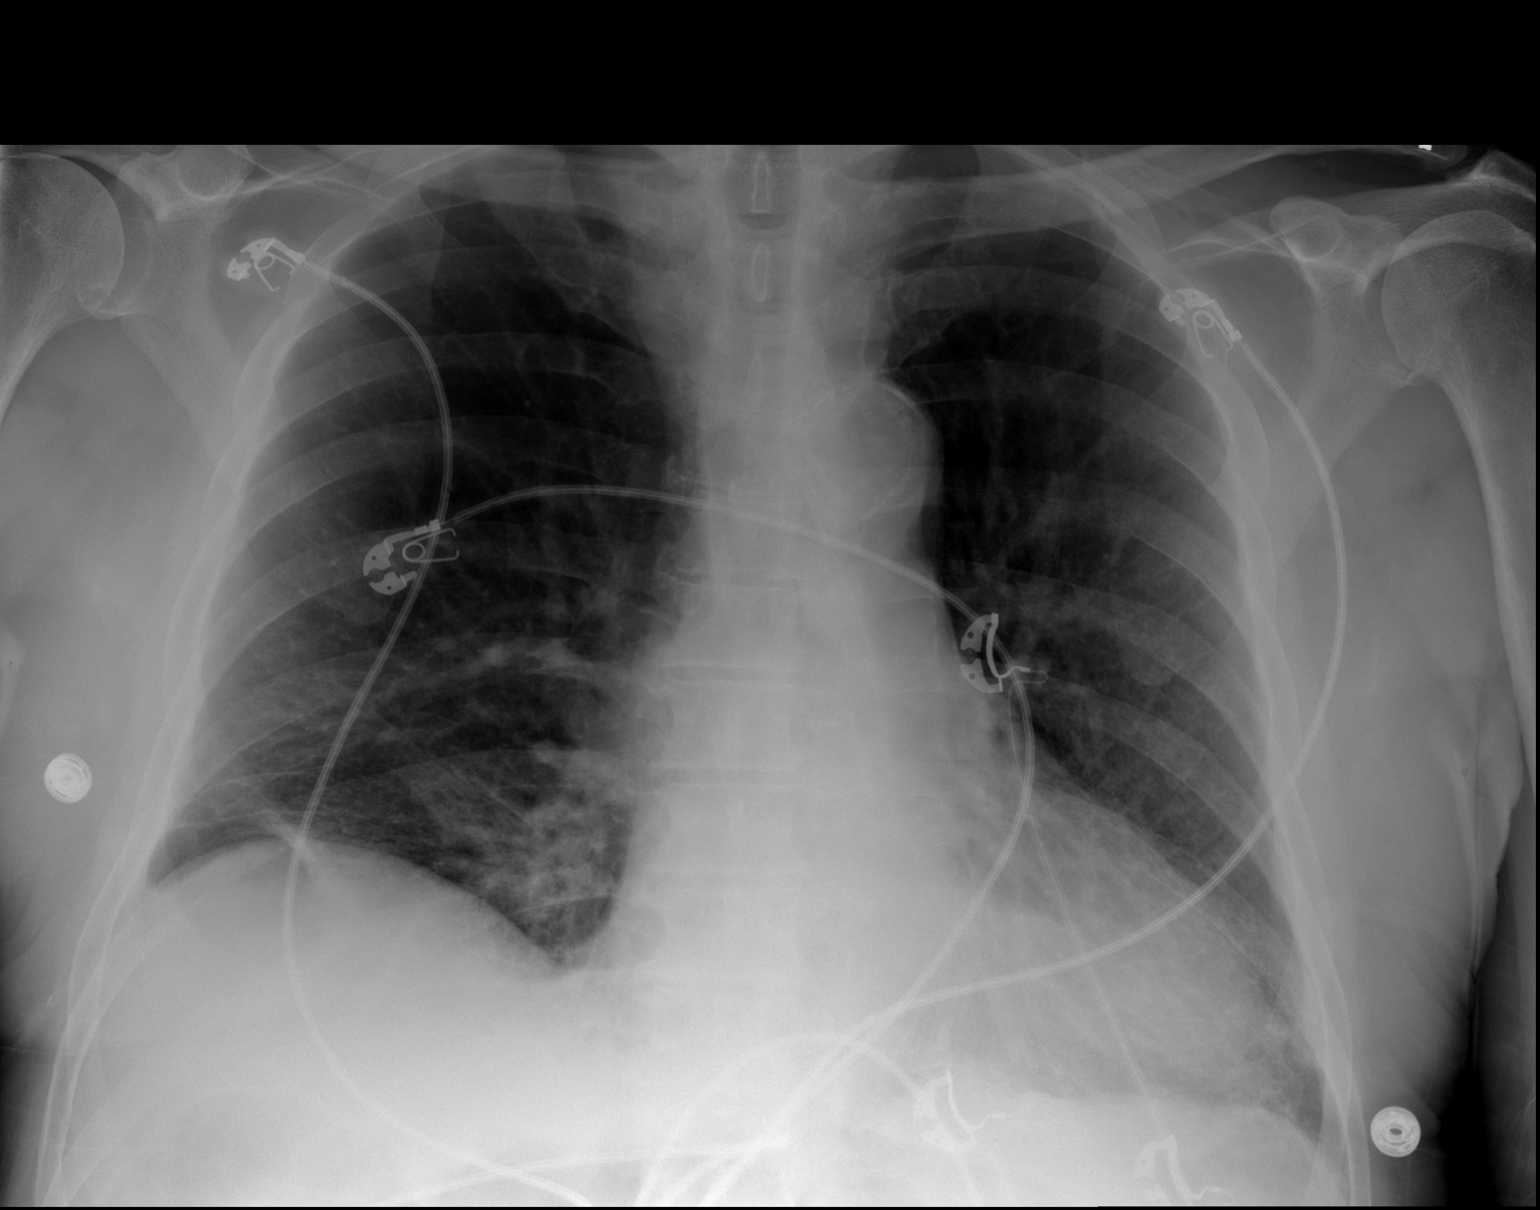

[2 of 2 positions shown; findings below may reference images not displayed]

FINDINGS: There are lower lung volumes with mild elevation of the right
hemidiaphragm. The heart size and mediastinal contours are stable.
There is mild cardiomegaly and aortic atherosclerosis. There is new
airspace opacity medially at the right lung base, projecting
inferior to the hilum on the lateral view, and likely in the middle
lobe. There is probable mild atelectasis at both lung bases. There
is a stable bleb peripherally at the right lung base. No
pneumothorax or significant pleural effusion. The bones appear
unchanged. Telemetry leads overlie the chest.
IMPRESSION: New right infrahilar airspace opacity, likely in the middle lobe,
most consistent with early pneumonia. Followup PA and lateral chest
X-ray is recommended in 3-4 weeks following trial of antibiotic
therapy to ensure resolution and exclude underlying malignancy.

## 2021-03-12 IMAGING — DX ABDOMEN - 1 VIEW
1 series · 1 of 1 positions shown · non-contrast
Comparison: None.

CLINICAL DATA: NG tube placement

EXAM:
ABDOMEN - 1 VIEW

[abdomen kub]
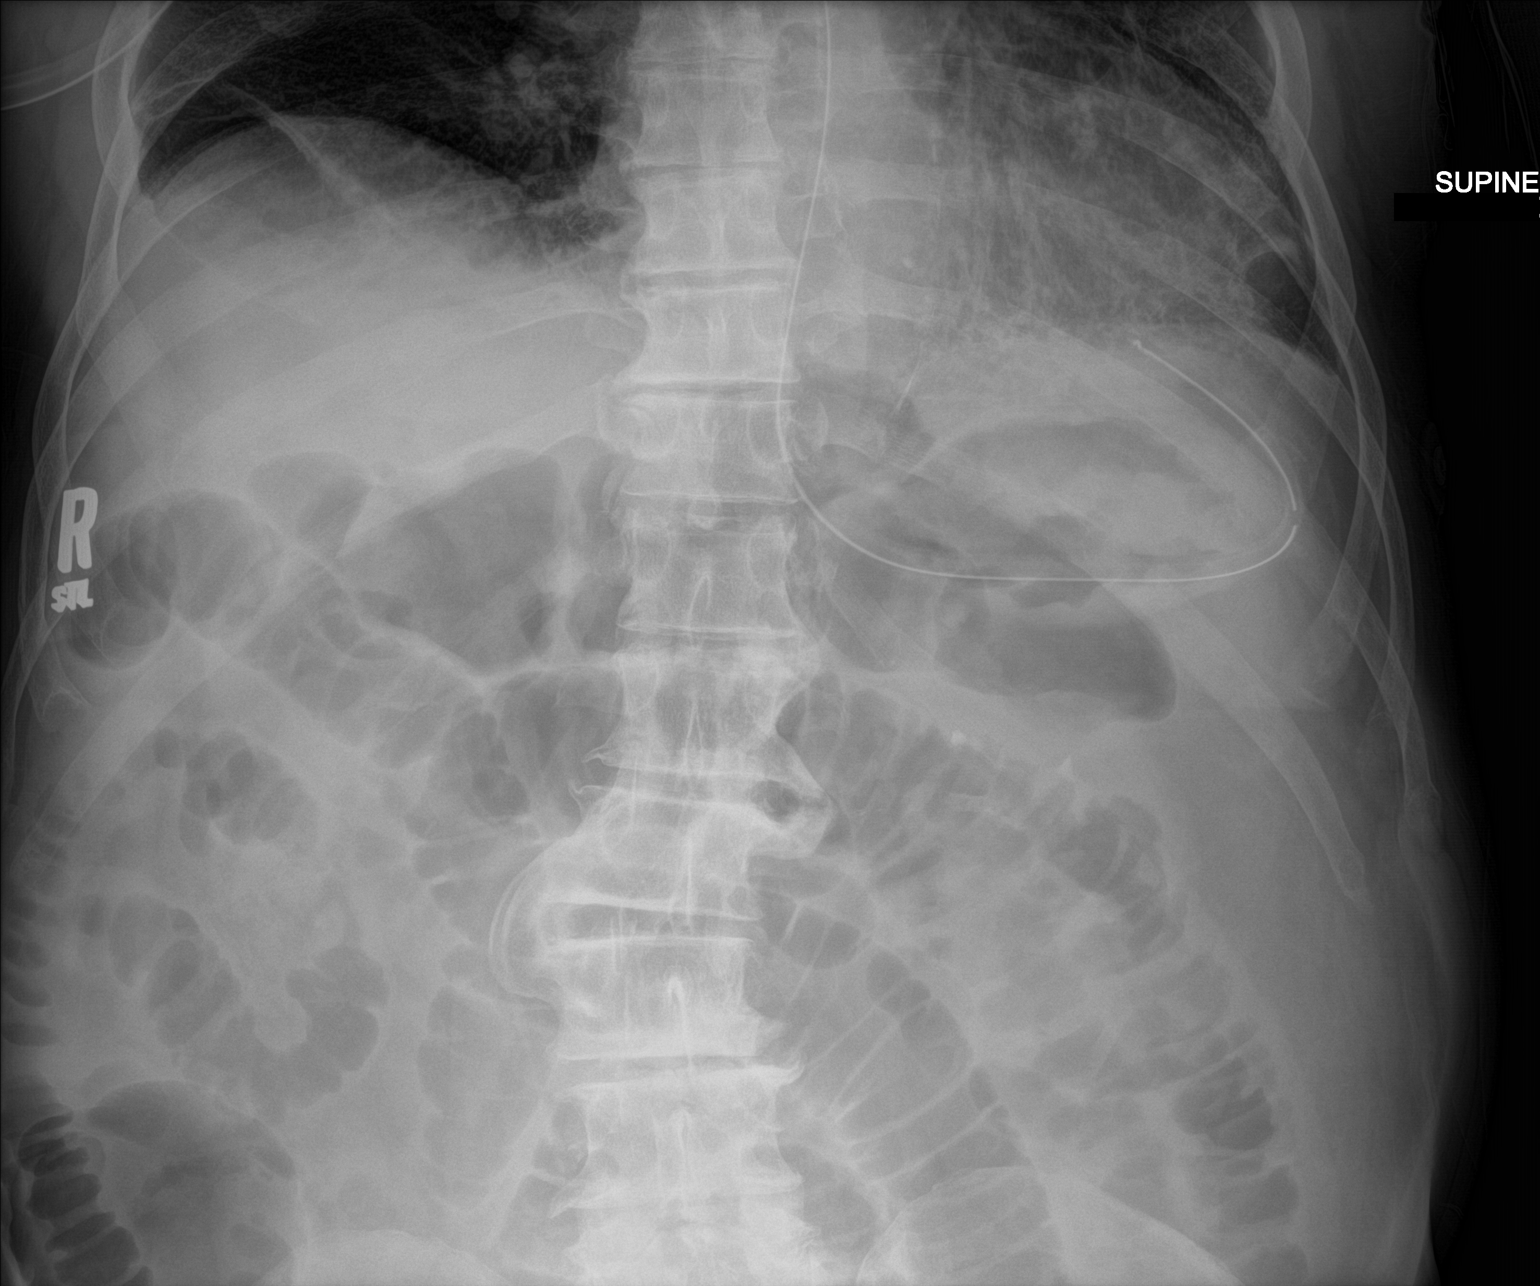

[1 of 1 positions shown; findings below may reference images not displayed]

FINDINGS: The enteric tube terminates over the stomach. There are multiple
dilated loops of small bowel. There is no pneumatosis or free air.
IMPRESSION: NG tube projects over the stomach.

## 2021-03-13 IMAGING — DX ABDOMEN - 1 VIEW
1 series · 1 of 1 positions shown · non-contrast
Comparison: 09/22/2018; CT abdomen pelvis-09/22/2018

CLINICAL DATA: Follow-up small-bowel obstruction. Persistent
abdominal distention.

EXAM:
ABDOMEN - 1 VIEW

[abdomen kub]
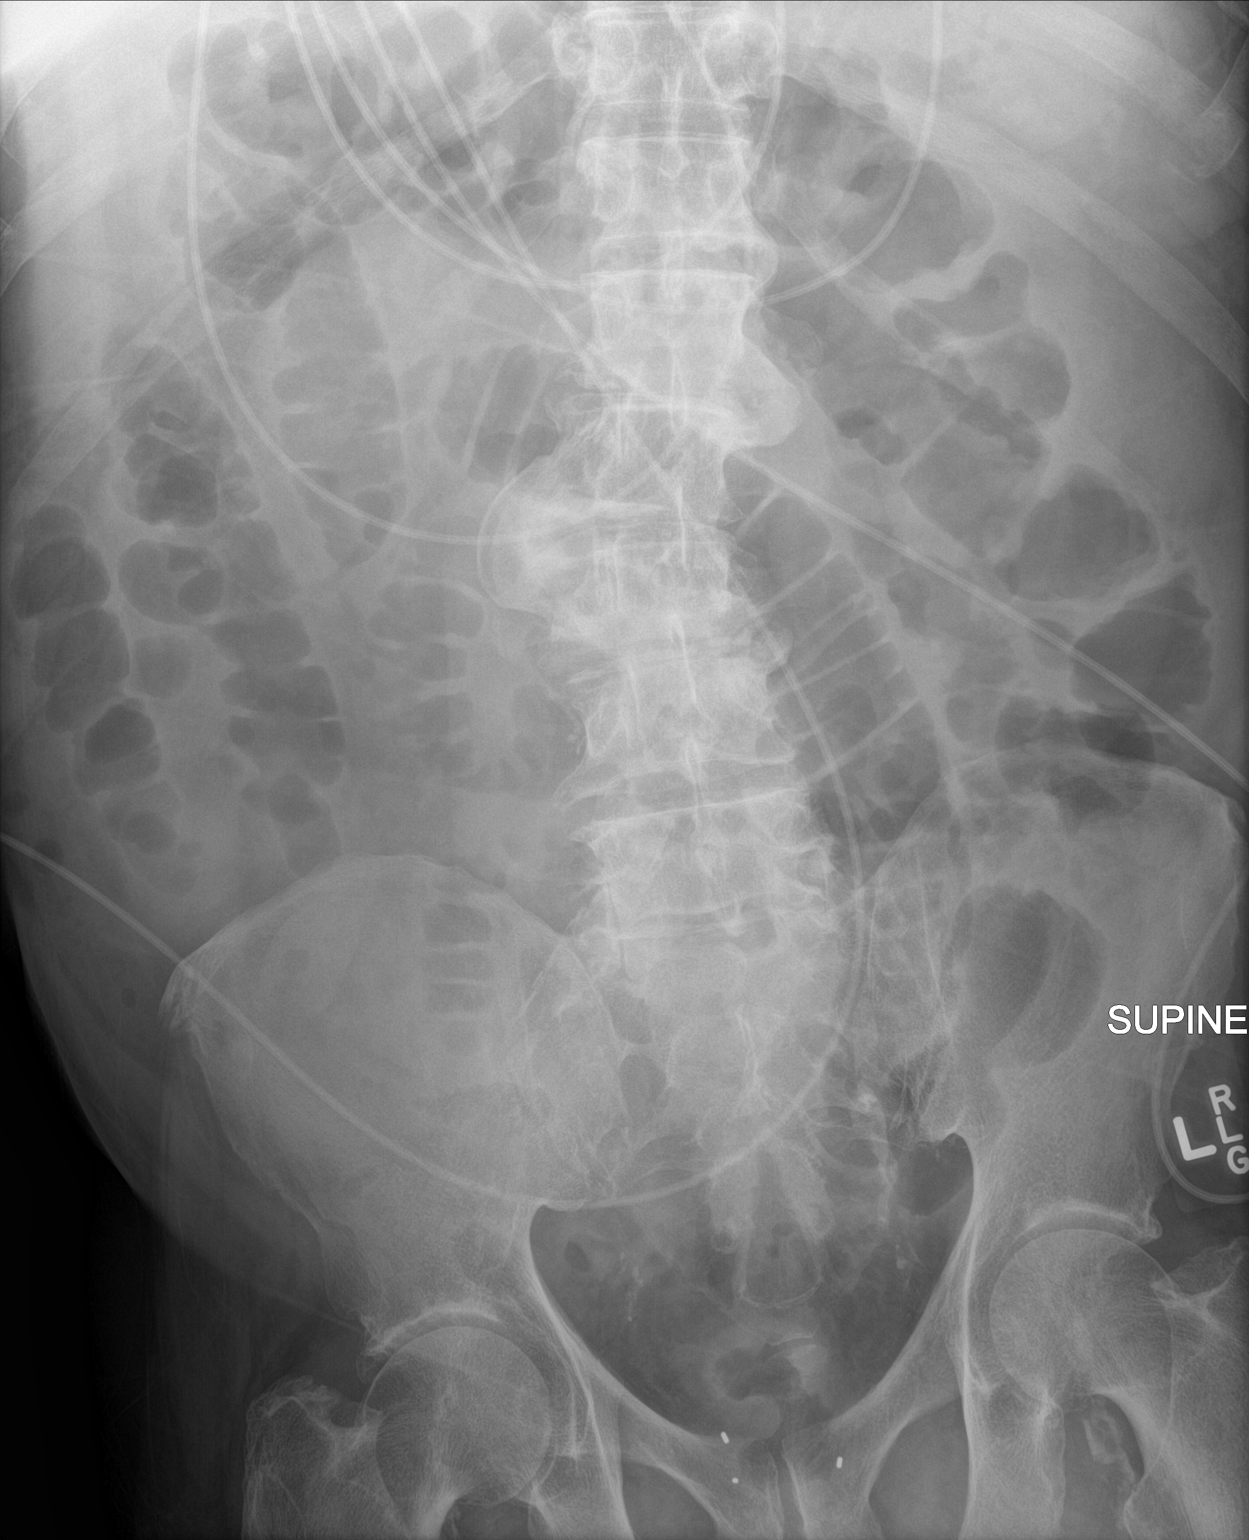

[1 of 1 positions shown; findings below may reference images not displayed]

FINDINGS: Previous identified enteric tube is no longer seen.

Re demonstrated moderate gaseous distention of multiple loops of
small bowel with index loop of small bowel within the mid
hemiabdomen measuring 3.8 cm diameter.

This finding is again associated with a relative paucity of distal
colonic gas with small amount of air seen within the ascending and
transverse colon.

Nondiagnostic evaluation for pneumoperitoneum secondary to supine
positioning and exclusion of the lower thorax. No pneumatosis or
portal venous gas.

No definitive abnormal intra-abdominal calcifications.

Moderate severe multilevel degenerative change of the lumbar spine
is suspected though incompletely evaluated.
IMPRESSION: 1. Similar findings worrisome for partial small bowel obstruction.
2. Nonvisualization of enteric tube, potentially excluded from view.
Clinical correlation is advised.
